# Patient Record
Sex: Male | Born: 1960
Health system: Southern US, Community
[De-identification: ages and names within clinical notes are randomized; demographics above are authoritative.]

## PROBLEM LIST (undated history)

## (undated) DIAGNOSIS — K649 Unspecified hemorrhoids: Secondary | ICD-10-CM

## (undated) DIAGNOSIS — E669 Obesity, unspecified: Secondary | ICD-10-CM

## (undated) DIAGNOSIS — M79676 Pain in unspecified toe(s): Secondary | ICD-10-CM

## (undated) DIAGNOSIS — G4733 Obstructive sleep apnea (adult) (pediatric): Secondary | ICD-10-CM

## (undated) DIAGNOSIS — K602 Anal fissure, unspecified: Secondary | ICD-10-CM

## (undated) DIAGNOSIS — G43109 Migraine with aura, not intractable, without status migrainosus: Secondary | ICD-10-CM

## (undated) DIAGNOSIS — M7700 Medial epicondylitis, unspecified elbow: Secondary | ICD-10-CM

## (undated) DIAGNOSIS — M7710 Lateral epicondylitis, unspecified elbow: Secondary | ICD-10-CM

## (undated) DIAGNOSIS — F419 Anxiety disorder, unspecified: Secondary | ICD-10-CM

## (undated) DIAGNOSIS — E785 Hyperlipidemia, unspecified: Secondary | ICD-10-CM

## (undated) DIAGNOSIS — D369 Benign neoplasm, unspecified site: Secondary | ICD-10-CM

## (undated) DIAGNOSIS — E039 Hypothyroidism, unspecified: Secondary | ICD-10-CM

## (undated) DIAGNOSIS — E119 Type 2 diabetes mellitus without complications: Secondary | ICD-10-CM

## (undated) DIAGNOSIS — E291 Testicular hypofunction: Secondary | ICD-10-CM

## (undated) DIAGNOSIS — E079 Disorder of thyroid, unspecified: Secondary | ICD-10-CM

## (undated) HISTORY — DX: Morbid (severe) obesity due to excess calories: E66.01

## (undated) HISTORY — DX: Migraine with aura, not intractable, without status migrainosus: G43.109

## (undated) HISTORY — DX: Benign neoplasm, unspecified site: D36.9

## (undated) HISTORY — PX: VASECTOMY: SHX75

## (undated) HISTORY — DX: Disorder of thyroid, unspecified: E07.9

## (undated) HISTORY — DX: Hyperlipidemia, unspecified: E78.5

## (undated) HISTORY — DX: Type 2 diabetes mellitus without complications: E11.9

## (undated) HISTORY — DX: Obesity, unspecified: E66.9

## (undated) HISTORY — PX: OTHER SURGICAL HISTORY: SHX169

## (undated) HISTORY — DX: Medial epicondylitis, unspecified elbow: M77.00

## (undated) HISTORY — PX: LAPAROSCOPIC GASTRIC SLEEVE RESECTION: SHX5895

## (undated) HISTORY — DX: Unspecified hemorrhoids: K64.9

## (undated) HISTORY — DX: Pain in unspecified toe(s): M79.676

## (undated) HISTORY — DX: Anxiety disorder, unspecified: F41.9

## (undated) HISTORY — DX: Obstructive sleep apnea (adult) (pediatric): G47.33

## (undated) HISTORY — DX: Testicular hypofunction: E29.1

## (undated) HISTORY — PX: ROTATOR CUFF REPAIR: SHX139

## (undated) HISTORY — DX: Hypothyroidism, unspecified: E03.9

## (undated) HISTORY — DX: Anal fissure, unspecified: K60.2

## (undated) HISTORY — DX: Lateral epicondylitis, unspecified elbow: M77.10

---
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
## 2013-11-22 DIAGNOSIS — E668 Other obesity: Secondary | ICD-10-CM | POA: Insufficient documentation

## 2013-11-22 DIAGNOSIS — E119 Type 2 diabetes mellitus without complications: Secondary | ICD-10-CM | POA: Insufficient documentation

## 2013-11-22 DIAGNOSIS — M8580 Other specified disorders of bone density and structure, unspecified site: Secondary | ICD-10-CM | POA: Insufficient documentation

## 2014-10-15 ENCOUNTER — Ambulatory Visit (INDEPENDENT_AMBULATORY_CARE_PROVIDER_SITE_OTHER): Payer: 59 | Admitting: Emergency Medicine

## 2014-10-15 VITALS — BP 122/80 | HR 60 | Temp 98.1°F | Resp 20 | Ht 75.0 in | Wt 298.2 lb

## 2014-10-15 DIAGNOSIS — J0140 Acute pansinusitis, unspecified: Secondary | ICD-10-CM

## 2014-10-15 DIAGNOSIS — J209 Acute bronchitis, unspecified: Secondary | ICD-10-CM

## 2014-10-15 DIAGNOSIS — E782 Mixed hyperlipidemia: Secondary | ICD-10-CM

## 2014-10-15 DIAGNOSIS — E039 Hypothyroidism, unspecified: Secondary | ICD-10-CM

## 2014-10-15 LAB — POCT CBC
Granulocyte percent: 80.3 %G — AB (ref 37–80)
HCT, POC: 47.8 % (ref 43.5–53.7)
Hemoglobin: 13 g/dL — AB (ref 14.1–18.1)
Lymph, poc: 1.2 (ref 0.6–3.4)
MCH, POC: 29.1 pg (ref 27–31.2)
MCHC: 33.4 g/dL (ref 31.8–35.4)
MCV: 87.1 fL (ref 80–97)
MID (cbc): 0.5 (ref 0–0.9)
MPV: 5.8 fL (ref 0–99.8)
POC Granulocyte: 7.1 — AB (ref 2–6.9)
POC LYMPH PERCENT: 13.5 %L (ref 10–50)
POC MID %: 6.5 %M (ref 0–12)
Platelet Count, POC: 261 10*3/uL (ref 142–424)
RBC: 5.48 M/uL (ref 4.69–6.13)
RDW, POC: 13.5 %
WBC: 8.8 10*3/uL (ref 4.6–10.2)

## 2014-10-15 LAB — TESTOSTERONE: Testosterone: 266 ng/dL — ABNORMAL LOW (ref 300–890)

## 2014-10-15 LAB — LIPID PANEL
Cholesterol: 261 mg/dL — ABNORMAL HIGH (ref 0–200)
HDL: 58 mg/dL (ref >39)
LDL Cholesterol: 179 mg/dL — ABNORMAL HIGH (ref 0–99)
Total CHOL/HDL Ratio: 4.5 Ratio
Triglycerides: 121 mg/dL (ref <150)
VLDL: 24 mg/dL (ref 0–40)

## 2014-10-15 LAB — COMPREHENSIVE METABOLIC PANEL
ALT: 19 U/L (ref 0–53)
AST: 19 U/L (ref 0–37)
Albumin: 4.2 g/dL (ref 3.5–5.2)
Alkaline Phosphatase: 68 U/L (ref 39–117)
BUN: 17 mg/dL (ref 6–23)
CO2: 30 mEq/L (ref 19–32)
Calcium: 9.3 mg/dL (ref 8.4–10.5)
Chloride: 100 mEq/L (ref 96–112)
Creat: 0.94 mg/dL (ref 0.50–1.35)
Glucose, Bld: 111 mg/dL — ABNORMAL HIGH (ref 70–99)
Potassium: 4.6 mEq/L (ref 3.5–5.3)
Sodium: 137 mEq/L (ref 135–145)
Total Bilirubin: 0.5 mg/dL (ref 0.2–1.2)
Total Protein: 7.1 g/dL (ref 6.0–8.3)

## 2014-10-15 LAB — TSH: TSH: 4.51 u[IU]/mL — ABNORMAL HIGH (ref 0.350–4.500)

## 2014-10-15 MED ORDER — HYDROCOD POLST-CHLORPHEN POLST 10-8 MG/5ML PO LQCR
5.0000 mL | Freq: Two times a day (BID) | ORAL | Status: DC | PRN
Start: 2014-10-15 — End: 2016-01-12

## 2014-10-15 MED ORDER — PSEUDOEPHEDRINE-GUAIFENESIN ER 60-600 MG PO TB12: 1.0000 | ORAL_TABLET | Freq: Two times a day (BID) | ORAL | Status: AC

## 2014-10-15 MED ORDER — AMOXICILLIN-POT CLAVULANATE 875-125 MG PO TABS: 1.0000 | ORAL_TABLET | Freq: Two times a day (BID) | ORAL | Status: DC

## 2014-10-15 NOTE — Addendum Note (Signed)
Addended by: Frutoso Chase A on: 10/15/2014 09:27 AM   Modules accepted: Orders

## 2014-10-15 NOTE — Patient Instructions (Signed)

## 2014-10-15 NOTE — Progress Notes (Addendum)
[facility] [address], [place] [address] [phone]  Date:  10/15/2014   Name:  Nathan Finley   DOB:  [birth year]   MRN:  [id number]  PCP:  No PCP Per Patient    Chief Complaint: Cough; Sinusitis; Lab Work; and Medication Refill   History of Present Illness:  Nathan Finley is a 54 y.o. very pleasant male patient who presents with the following:  Ill with nasal congestion and post nasal drainage and a purulent nasal drip.  Ill since [place] Year Has a cough productive of purulent sputum No wheezing or shortness of breath No fever or chills.  No nausea or vomiting No stool change or rash No improvement with over the counter medications or other home remedies.  Denies other complaint or health concern today.   There are no active problems to display for this patient.   Past Medical History  Diagnosis Date  . Anxiety   . Diabetes mellitus without complication   . Thyroid disease     hypothyroidism    Past Surgical History  Procedure Laterality Date  . Vasectomy    . Laparoscopic gastric sleeve resection      History  Substance Use Topics  . Smoking status: Never Smoker   . Smokeless tobacco: Not on file  . Alcohol Use: No    Family History  Problem Relation Age of Onset  . Heart disease Father     Allergies  Allergen Reactions  . Iodine Hives    Medication list has been reviewed and updated.  No current outpatient prescriptions on file prior to visit.   No current facility-administered medications on file prior to visit.    Review of Systems:  As per HPI, otherwise negative.    Physical Examination: Filed Vitals:   10/15/14 0839  BP: 122/80  Pulse: 60  Temp: 98.1 F (36.7 C)  Resp: 20   Filed Vitals:   10/15/14 0839  Height: 6\' 3"  (1.905 m)  Weight: 298 lb 3.2 oz (135.263 kg)   Body mass index is 37.27 kg/(m^2). Ideal Body Weight: Weight in (lb) to have BMI = 25: 199.6  GEN: WDWN, NAD, Non-toxic, A & O x  3 HEENT: Atraumatic, Normocephalic. Neck supple. No masses, No LAD. Ears and Nose: No external deformity. CV: RRR, No M/G/R. No JVD. No thrill. No extra heart sounds. PULM: scattered wheezes, no crackles, rhonchi. No retractions. No resp. distress. No accessory muscle use. ABD: S, NT, ND, +BS. No rebound. No HSM. EXTR: No c/c/e NEURO Normal gait.  PSYCH: Normally interactive. Conversant. Not depressed or anxious appearing.  Calm demeanor.    Assessment and Plan: Augment mucinex tussionex  Signed,  Ellison Carwin, MD   Results for orders placed or performed in visit on 10/15/14  Comprehensive metabolic panel  Result Value Ref Range   Sodium 137 135 - 145 mEq/L   Potassium 4.6 3.5 - 5.3 mEq/L   Chloride 100 96 - 112 mEq/L   CO2 30 19 - 32 mEq/L   Glucose, Bld 111 (H) 70 - 99 mg/dL   BUN 17 6 - 23 mg/dL   Creat 0.94 0.50 - 1.35 mg/dL   Total Bilirubin 0.5 0.2 - 1.2 mg/dL   Alkaline Phosphatase 68 39 - 117 U/L   AST 19 0 - 37 U/L   ALT 19 0 - 53 U/L   Total Protein 7.1 6.0 - 8.3 g/dL   Albumin 4.2 3.5 - 5.2 g/dL   Calcium 9.3 8.4 -  10.5 mg/dL  Lipid panel  Result Value Ref Range   Cholesterol 261 (H) 0 - 200 mg/dL   Triglycerides 121 <150 mg/dL   HDL 58 >39 mg/dL   Total CHOL/HDL Ratio 4.5 Ratio   VLDL 24 0 - 40 mg/dL   LDL Cholesterol 179 (H) 0 - 99 mg/dL  Testosterone  Result Value Ref Range   Testosterone 266 (L) 300 - 890 ng/dL  TSH  Result Value Ref Range   TSH 4.510 (H) 0.350 - 4.500 uIU/mL  POCT CBC  Result Value Ref Range   WBC 8.8 4.6 - 10.2 K/uL   Lymph, poc 1.2 0.6 - 3.4   POC LYMPH PERCENT 13.5 10 - 50 %L   MID (cbc) 0.5 0 - 0.9   POC MID % 6.5 0 - 12 %M   POC Granulocyte 7.1 (A) 2 - 6.9   Granulocyte percent 80.3 (A) 37 - 80 %G   RBC 5.48 4.69 - 6.13 M/uL   Hemoglobin 13.0 (A) 14.1 - 18.1 g/dL   HCT, POC 47.8 43.5 - 53.7 %   MCV 87.1 80 - 97 fL   MCH, POC 29.1 27 - 31.2 pg   MCHC 33.4 31.8 - 35.4 g/dL   RDW, POC 13.5 %   Platelet Count,  POC 261 142 - 424 K/uL   MPV 5.8 0 - 99.8 fL   Results for orders placed or performed in visit on 10/15/14  Comprehensive metabolic panel  Result Value Ref Range   Sodium 137 135 - 145 mEq/L   Potassium 4.6 3.5 - 5.3 mEq/L   Chloride 100 96 - 112 mEq/L   CO2 30 19 - 32 mEq/L   Glucose, Bld 111 (H) 70 - 99 mg/dL   BUN 17 6 - 23 mg/dL   Creat 0.94 0.50 - 1.35 mg/dL   Total Bilirubin 0.5 0.2 - 1.2 mg/dL   Alkaline Phosphatase 68 39 - 117 U/L   AST 19 0 - 37 U/L   ALT 19 0 - 53 U/L   Total Protein 7.1 6.0 - 8.3 g/dL   Albumin 4.2 3.5 - 5.2 g/dL   Calcium 9.3 8.4 - 10.5 mg/dL  Lipid panel  Result Value Ref Range   Cholesterol 261 (H) 0 - 200 mg/dL   Triglycerides 121 <150 mg/dL   HDL 58 >39 mg/dL   Total CHOL/HDL Ratio 4.5 Ratio   VLDL 24 0 - 40 mg/dL   LDL Cholesterol 179 (H) 0 - 99 mg/dL  Testosterone  Result Value Ref Range   Testosterone 266 (L) 300 - 890 ng/dL  TSH  Result Value Ref Range   TSH 4.510 (H) 0.350 - 4.500 uIU/mL  POCT CBC  Result Value Ref Range   WBC 8.8 4.6 - 10.2 K/uL   Lymph, poc 1.2 0.6 - 3.4   POC LYMPH PERCENT 13.5 10 - 50 %L   MID (cbc) 0.5 0 - 0.9   POC MID % 6.5 0 - 12 %M   POC Granulocyte 7.1 (A) 2 - 6.9   Granulocyte percent 80.3 (A) 37 - 80 %G   RBC 5.48 4.69 - 6.13 M/uL   Hemoglobin 13.0 (A) 14.1 - 18.1 g/dL   HCT, POC 47.8 43.5 - 53.7 %   MCV 87.1 80 - 97 fL   MCH, POC 29.1 27 - 31.2 pg   MCHC 33.4 31.8 - 35.4 g/dL   RDW, POC 13.5 %   Platelet Count, POC 261 142 - 424 K/uL   MPV  5.8 0 - 99.8 fL   Patient needs to follow up with endocrinology for BS 111, cholesterol 261, LDL 179, TSH 4.51.  His testosterone is still low at 266.

## 2014-10-16 ENCOUNTER — Other Ambulatory Visit: Payer: Self-pay | Admitting: Emergency Medicine

## 2014-10-16 MED ORDER — LEVOTHYROXINE SODIUM 112 MCG PO TABS: 112.0000 ug | ORAL_TABLET | Freq: Every day | ORAL | Status: DC

## 2014-10-16 MED ORDER — ATORVASTATIN CALCIUM 40 MG PO TABS: 40.0000 mg | ORAL_TABLET | Freq: Every day | ORAL | Status: DC

## 2014-10-18 ENCOUNTER — Telehealth: Payer: Self-pay

## 2014-10-18 NOTE — Telephone Encounter (Signed)
Patient needs to follow up with endocrinology for BS 111, cholesterol 261, LDL 179, TSH 4.51. His testosterone is still low at 266.-Dr. Ouida Sills  Pt notified and copy sent to take with him to endo

## 2015-04-26 ENCOUNTER — Other Ambulatory Visit: Payer: Self-pay | Admitting: Emergency Medicine

## 2015-11-24 ENCOUNTER — Ambulatory Visit (INDEPENDENT_AMBULATORY_CARE_PROVIDER_SITE_OTHER): Payer: 59 | Admitting: Family Medicine

## 2015-11-24 VITALS — BP 114/72 | HR 79 | Temp 98.1°F | Resp 16 | Ht 74.75 in | Wt 323.8 lb

## 2015-11-24 DIAGNOSIS — J111 Influenza due to unidentified influenza virus with other respiratory manifestations: Secondary | ICD-10-CM

## 2015-11-24 MED ORDER — OSELTAMIVIR PHOSPHATE 75 MG PO CAPS: 75.0000 mg | ORAL_CAPSULE | Freq: Two times a day (BID) | ORAL | Status: DC

## 2015-11-24 NOTE — Progress Notes (Signed)
Nathan Finley MRN: [id number], DOB: 08/17/1961, 55 y.o. Date of Encounter: 11/24/2015, 4:22 PM  Primary Physician: No PCP Per Patient  Chief Complaint:  Chief Complaint  Patient presents with  . Sore Throat  . Cough    Non productive  . Headache  . Nasal Congestion  . Fatigue  . Generalized Body Aches    HPI: 55 y.o. year old male presents with a 4 day history of nasal congestion, post nasal drip, sore throat, and cough. Mild sinus pressure. Afebrile. No chills. Nasal congestion thick and green/yellow. Cough is productive of green/yellow sputum and not associated with time of day. Ears feel full, leading to sensation of muffled hearing. Has tried OTC cold preps without success. No GI complaints.   No sick contacts, recent antibiotics, or recent travels.   Patient is a Fish farm manager disability judge  No leg trauma, sedentary periods, h/o cancer, or tobacco use.  Past Medical History  Diagnosis Date  . Anxiety   . Diabetes mellitus without complication ([place])   . Thyroid disease     hypothyroidism     Home Meds: Prior to Admission medications   Medication Sig Start Date End Date Taking? Authorizing Provider  diazepam (VALIUM) 10 MG tablet Take 10 mg by mouth every 12 (twelve) hours as needed for anxiety.   Yes Historical Provider, MD  FLUoxetine (PROZAC) 20 MG capsule Take 20 mg by mouth daily.   Yes Historical Provider, MD  Omega-3 Fatty Acids (FISH OIL PO) Take by mouth.   Yes Historical Provider, MD  Thyroid (NATURE-THROID PO) Take by mouth.   Yes Historical Provider, MD  amoxicillin-clavulanate (AUGMENTIN) 875-125 MG per tablet Take 1 tablet by mouth 2 (two) times daily. Patient not taking: Reported on 11/24/2015 10/15/14   Roselee Culver, MD  atorvastatin (LIPITOR) 40 MG tablet Take 1 tablet (40 mg total) by mouth daily. PATIENT NEEDS OFFICE VISIT FOR ADDITIONAL REFILLS Patient not taking: Reported on 11/24/2015 04/28/15   Mancel Bale, PA-C   chlorpheniramine-HYDROcodone (TUSSIONEX PENNKINETIC ER) 10-8 MG/5ML LQCR Take 5 mLs by mouth every 12 (twelve) hours as needed. Patient not taking: Reported on 11/24/2015 10/15/14   Roselee Culver, MD  levothyroxine (SYNTHROID, LEVOTHROID) 112 MCG tablet Take 1 tablet (112 mcg total) by mouth daily before breakfast. Patient not taking: Reported on 11/24/2015 10/16/14   Roselee Culver, MD  oseltamivir (TAMIFLU) 75 MG capsule Take 1 capsule (75 mg total) by mouth 2 (two) times daily. 11/24/15   Robyn Haber, MD  testosterone Renae Gloss) 5 MG/24HR Place 1 patch onto the skin daily. Reported on 11/24/2015    Historical Provider, MD    Allergies:  Allergies  Allergen Reactions  . Iodine Hives    Social History   Social History  . Marital Status: Married    Spouse Name: N/A  . Number of Children: N/A  . Years of Education: N/A   Occupational History  . Not on file.   Social History Main Topics  . Smoking status: Never Smoker   . Smokeless tobacco: Not on file  . Alcohol Use: No  . Drug Use: No  . Sexual Activity: Not on file   Other Topics Concern  . Not on file   Social History Narrative     Review of Systems: Constitutional: negative for chills, fever, night sweats or weight changes Cardiovascular: negative for chest pain or palpitations Respiratory: negative for hemoptysis, wheezing, or shortness of breath Abdominal: negative for abdominal pain, nausea, vomiting or diarrhea Dermatological: negative  for rash Neurologic: negative for headache   Physical Exam: Blood pressure 114/72, pulse 79, temperature 98.1 F (36.7 C), temperature source Oral, resp. rate 16, height 6' 2.75" (1.899 m), weight 323 lb 12.8 oz (146.875 kg), SpO2 98 %., Body mass index is 40.73 kg/(m^2). General: Well developed, well nourished, in no acute distress. Head: Normocephalic, atraumatic, eyes without discharge, sclera non-icteric, nares are congested. Bilateral auditory canals clear, TM's  are without perforation, pearly grey with reflective cone of light bilaterally. No sinus TTP. Oral cavity moist, dentition normal. Posterior pharynx with post nasal drip and mild erythema. No peritonsillar abscess or tonsillar exudate. Neck: Supple. No thyromegaly. Full ROM. No lymphadenopathy. Lungs: Coarse breath sounds bilaterally without wheezes, rales, or rhonchi. Breathing is unlabored.  Heart: RRR with S1 S2. No murmurs, rubs, or gallops appreciated. Msk:  Strength and tone normal for age. Extremities: No clubbing or cyanosis. No edema. Neuro: Alert and oriented X 3. Moves all extremities spontaneously. CNII-XII grossly in tact. Psych:  Responds to questions appropriately with a normal affect.      ASSESSMENT AND PLAN:  55 y.o. year old male with bronchitis. -   ICD-9-CM ICD-10-CM   1. Influenza with respiratory manifestation 487.1 J11.1 oseltamivir (TAMIFLU) 75 MG capsule   -Tylenol/Motrin prn -Rest/fluids -RTC precautions -RTC 3-5 days if no improvement  Signed, Robyn Haber, MD 11/24/2015 4:22 PM

## 2015-11-24 NOTE — Patient Instructions (Signed)

## 2016-01-12 ENCOUNTER — Ambulatory Visit (INDEPENDENT_AMBULATORY_CARE_PROVIDER_SITE_OTHER): Payer: 59 | Admitting: Urgent Care

## 2016-01-12 VITALS — BP 124/80 | HR 74 | Temp 98.2°F | Resp 18 | Ht 74.75 in | Wt 320.0 lb

## 2016-01-12 DIAGNOSIS — M25521 Pain in right elbow: Secondary | ICD-10-CM | POA: Diagnosis not present

## 2016-01-12 MED ORDER — DICLOFENAC SODIUM 1 % TD GEL: TRANSDERMAL | Status: DC

## 2016-01-12 NOTE — Patient Instructions (Addendum)
Tennis Elbow Tennis elbow (lateral epicondylitis) is inflammation of the outer tendons of your forearm close to your elbow. Your tendons attach your muscles to your bones. The outer tendons of your forearm are used to extend your wrist, and they attach on the outside part of your elbow. Tennis elbow is often found in people who play tennis, but anyone may get the condition from repeatedly extending the wrist or turning the forearm. CAUSES This condition is caused by repeatedly extending your wrist and using your hands. It can result from sports or work that requires repetitive forearm movements. Tennis elbow may also be caused by an injury. RISK FACTORS You have a higher risk of developing tennis elbow if you play tennis or another racquet sport. You also have a higher risk if you frequently use your hands for work. This condition is also more likely to develop in:  Musicians.  Carpenters, painters, and plumbers.  Cooks.  Cashiers.  People who work in factories.  Construction workers.  Butchers.  People who use computers. SYMPTOMS Symptoms of this condition include:  Pain and tenderness in your forearm and the outer part of your elbow. You may only feel the pain when you use your arm, or you may feel it even when you are not using your arm.  A burning feeling that runs from your elbow through your arm.  Weak grip in your hands. DIAGNOSIS  This condition may be diagnosed by medical history and physical exam. You may also have other tests, including:  X-rays.  MRI. TREATMENT Your health care provider will recommend lifestyle adjustments, such as resting and icing your arm. Treatment may also include:  Medicines for inflammation. This may include shots of cortisone if your pain continues.  Physical therapy. This may include massage or exercises.  An elbow brace. Surgery may eventually be recommended if your pain does not go away with treatment. HOME CARE  INSTRUCTIONS Activity  Rest your elbow and wrist as directed by your health care provider. Try to avoid any activities that caused the problem until your health care provider says that you can do them again.  If a physical therapist teaches you exercises, do all of them as directed.  If you lift an object, lift it with your palm facing upward. This lowers the stress on your elbow. Lifestyle  If your tennis elbow is caused by sports, check your equipment and make sure that:  You are using it correctly.  It is the best fit for you.  If your tennis elbow is caused by work, take breaks frequently, if you are able. Talk with your manager about how to best perform tasks in a way that is safe.  If your tennis elbow is caused by computer use, talk with your manager about any changes that can be made to your work environment. General Instructions  If directed, apply ice to the painful area:  Put ice in a plastic bag.  Place a towel between your skin and the bag.  Leave the ice on for 20 minutes, 2-3 times per day.  Take medicines only as directed by your health care provider.  If you were given a brace, wear it as directed by your health care provider.  Keep all follow-up visits as directed by your health care provider. This is important. SEEK MEDICAL CARE IF:  Your pain does not get better with treatment.  Your pain gets worse.  You have numbness or weakness in your forearm, hand, or fingers.     This information is not intended to replace advice given to you by your health care provider. Make sure you discuss any questions you have with your health care provider.   Document Released: 09/25/2005 Document Revised: 02/09/2015 Document Reviewed: 09/21/2014 Elsevier Interactive Patient Education 2016 [employer].     IF you received an x-ray today, you will receive an invoice from [facility]. Please contact [facility] at [phone] with questions or  concerns regarding your invoice.   IF you received labwork today, you will receive an invoice from [employer]. Please contact [facility] at [phone] with questions or concerns regarding your invoice.   Our billing staff will not be able to assist you with questions regarding bills from these companies.  You will be contacted with the lab results as soon as they are available. The fastest way to get your results is to activate your My Chart account. Instructions are located on the last page of this paperwork. If you have not heard from [place] regarding the results in 2 weeks, please contact this office.

## 2016-01-12 NOTE — Progress Notes (Signed)
    MRN: [id number] DOB: 1961/01/19  Subjective:   Nathan Finley is a 55 y.o. male presenting for chief complaint of Elbow Pain  Reports 4 week history of right elbow pain. Pain started after mixing a lot of ground meat for food. Patient decided to rest for 1 week, went back and decided to rest more. He still has pain, now having numbness and tingling in his 4-5th fingers. Reports that he hit his elbow 2 days ago and has felt significant pain. Currently, pain is rated 3/10, throbbing in nature with intermittent sharp pains. Patient has gastric sleeve and cannot take NSAIDs. Denies history of elbow surgeries.  Tierney has a current medication list which includes the following prescription(s): diazepam, fluoxetine, levothyroxine, omega-3 fatty acids, and testosterone cypionate. Also is allergic to iodine.  Kenniel  has a past medical history of Anxiety; Diabetes mellitus without complication ([place]); and Thyroid disease. Also  has past surgical history that includes Vasectomy and Laparoscopic gastric sleeve resection.  Objective:   Vitals: BP 124/80 mmHg  Pulse 74  Temp(Src) 98.2 F (36.8 C) (Oral)  Resp 18  Ht 6' 2.75" (1.899 m)  Wt 320 lb (145.151 kg)  BMI 40.25 kg/m2  SpO2 97%  Physical Exam  Constitutional: He is oriented to person, place, and time. He appears well-developed and well-nourished.  Cardiovascular: Normal rate.   Pulmonary/Chest: Effort normal.  Musculoskeletal:       Right elbow: He exhibits normal range of motion, no swelling, no effusion, no deformity and no laceration. Tenderness found. Medial epicondyle, lateral epicondyle (worse than medial) and olecranon process (mild) tenderness noted. No radial head tenderness noted.  Neurological: He is alert and oriented to person, place, and time. He has normal reflexes.  Sensation intact.   Assessment and Plan :   1. Elbow pain, right - Patient opted for conservative management. If no improvement in 2 weeks, patient opted  for no physical therapy and prefers to go to ortho.  Jaynee Eagles, PA-C [facility] [phone] 01/12/2016 6:46 PM

## 2016-02-09 DIAGNOSIS — K635 Polyp of colon: Secondary | ICD-10-CM | POA: Insufficient documentation

## 2016-02-09 DIAGNOSIS — E039 Hypothyroidism, unspecified: Secondary | ICD-10-CM | POA: Insufficient documentation

## 2016-03-13 DIAGNOSIS — E1169 Type 2 diabetes mellitus with other specified complication: Secondary | ICD-10-CM | POA: Insufficient documentation

## 2016-05-09 DIAGNOSIS — Z903 Acquired absence of stomach [part of]: Secondary | ICD-10-CM | POA: Insufficient documentation

## 2016-08-30 ENCOUNTER — Ambulatory Visit (INDEPENDENT_AMBULATORY_CARE_PROVIDER_SITE_OTHER): Payer: 59

## 2016-08-30 ENCOUNTER — Ambulatory Visit (INDEPENDENT_AMBULATORY_CARE_PROVIDER_SITE_OTHER): Payer: 59 | Admitting: Physician Assistant

## 2016-08-30 VITALS — BP 110/80 | HR 82 | Temp 98.2°F | Resp 17 | Ht 74.75 in | Wt 311.0 lb

## 2016-08-30 DIAGNOSIS — R0602 Shortness of breath: Secondary | ICD-10-CM

## 2016-08-30 DIAGNOSIS — R05 Cough: Secondary | ICD-10-CM

## 2016-08-30 DIAGNOSIS — J189 Pneumonia, unspecified organism: Secondary | ICD-10-CM

## 2016-08-30 DIAGNOSIS — R059 Cough, unspecified: Secondary | ICD-10-CM

## 2016-08-30 LAB — POCT CBC
Granulocyte percent: 74 %G (ref 37–80)
HCT, POC: 45.1 % (ref 43.5–53.7)
Hemoglobin: 16.2 g/dL (ref 14.1–18.1)
Lymph, poc: 1.9 (ref 0.6–3.4)
MCH, POC: 29.8 pg (ref 27–31.2)
MCHC: 36 g/dL — AB (ref 31.8–35.4)
MCV: 82.7 fL (ref 80–97)
MID (cbc): 0.8 (ref 0–0.9)
MPV: 6.1 fL (ref 0–99.8)
POC Granulocyte: 7.7 — AB (ref 2–6.9)
POC LYMPH PERCENT: 18.4 %L (ref 10–50)
POC MID %: 7.6 %M (ref 0–12)
Platelet Count, POC: 297 10*3/uL (ref 142–424)
RBC: 5.45 M/uL (ref 4.69–6.13)
RDW, POC: 13.3 %
WBC: 10.4 10*3/uL — AB (ref 4.6–10.2)

## 2016-08-30 MED ORDER — AZITHROMYCIN 250 MG PO TABS: ORAL_TABLET | ORAL | 0 refills | Status: DC

## 2016-08-30 MED ORDER — ALBUTEROL SULFATE HFA 108 (90 BASE) MCG/ACT IN AERS: 2.0000 | INHALATION_SPRAY | RESPIRATORY_TRACT | 1 refills | Status: DC | PRN

## 2016-08-30 NOTE — Progress Notes (Signed)
Nathan Finley  MRN: [id number] DOB: [birth year]  PCP: No PCP Per Patient  Subjective:  Pt Nathan Finley a 55 year old male, history of HLD, diabetes and hypothyroidism, who presents to clinic for SOB and cough.  He reports cough x eight weeks. His children were sick two months ago and he also developed a cough but thought it would resolve. It has been intermittent over the last several weeks, improving then worsening. Describes his cough as mostly non-productive, however occasionally coughs up white phlegm. He sometimes developes SOB like a feeling of "not being able to take ea full deep breath" the past few days. He occasionally loses his breath while talking then has coughing fit. Notes a few times of light-headedness when he stands too quickly from seated position.  He has not taken anything to feel better. Denies fever, chills, chest pain, palpitations.   Review of Systems  Constitutional: Negative for chills, diaphoresis, fatigue and fever.  Respiratory: Positive for cough and shortness of breath. Negative for chest tightness and wheezing.   Cardiovascular: Negative for chest pain, palpitations and leg swelling.  Gastrointestinal: Negative for abdominal pain, diarrhea, nausea and vomiting.  Neurological: Negative for dizziness, syncope, light-headedness and headaches.  Psychiatric/Behavioral: Negative for sleep disturbance.    There are no active problems to display for this patient.   Current Outpatient Prescriptions on File Prior to Visit  Medication Sig Dispense Refill  . levothyroxine (SYNTHROID, LEVOTHROID) 112 MCG tablet Take 1 tablet (112 mcg total) by mouth daily before breakfast. 30 tablet 2  . Omega-3 Fatty Acids (FISH OIL PO) Take by mouth.     No current facility-administered medications on file prior to visit.     Allergies  Allergen Reactions  . Iodine Hives     Objective:  BP 110/80 (BP Location: Right Arm, Patient Position: Sitting, Cuff Size: Large)   Pulse 82    Temp 98.2 F (36.8 C) (Oral)   Resp 17   Ht 6' 2.75" (1.899 m)   Wt (!) 311 lb (141.1 kg)   SpO2 97%   BMI 39.13 kg/m   Physical Exam  Constitutional: He is oriented to person, place, and time and well-developed, well-nourished, and in no distress. No distress.  obese  Cardiovascular: Normal rate, regular rhythm and normal heart sounds.   Pulmonary/Chest: Effort normal. No respiratory distress. He has no decreased breath sounds. He has no wheezes. He has rales in the left lower field.  Lymphadenopathy:    He has no cervical adenopathy.  Neurological: He is alert and oriented to person, place, and time. GCS score is 15.  Skin: Skin is warm and dry.  Psychiatric: Mood, memory, affect and judgment normal.  Vitals reviewed.  Dg Chest 2 View  Result Date: 08/30/2016 CLINICAL DATA:  Shortness of breath, cough for 8 weeks EXAM: CHEST  2 VIEW COMPARISON:  None. FINDINGS: The heart size and mediastinal contours are within normal limits. Both lungs are clear. The visualized skeletal structures are unremarkable. IMPRESSION: No active cardiopulmonary disease. Electronically Signed   By: Kathreen Devoid   On: 08/30/2016 14:24   Results for orders placed or performed in visit on 08/30/16  POCT CBC  Result Value Ref Range   WBC 10.4 (A) 4.6 - 10.2 K/uL   Lymph, poc 1.9 0.6 - 3.4   POC LYMPH PERCENT 18.4 10 - 50 %L   MID (cbc) 0.8 0 - 0.9   POC MID % 7.6 0 - 12 %M   POC Granulocyte 7.7 (A)  2 - 6.9   Granulocyte percent 74.0 37 - 80 %G   RBC 5.45 4.69 - 6.13 M/uL   Hemoglobin 16.2 14.1 - 18.1 g/dL   HCT, POC 45.1 43.5 - 53.7 %   MCV 82.7 80 - 97 fL   MCH, POC 29.8 27 - 31.2 pg   MCHC 36.0 (A) 31.8 - 35.4 g/dL   RDW, POC 13.3 %   Platelet Count, POC 297 142 - 424 K/uL   MPV 6.1 0 - 99.8 fL    Assessment and Plan :  1. Cough - POCT CBC - DG Chest 2 View; Future - albuterol (PROVENTIL HFA;VENTOLIN HFA) 108 (90 Base) MCG/ACT inhaler; Inhale 2 puffs into the lungs every 4 (four) hours as  needed for wheezing or shortness of breath (cough, shortness of breath or wheezing.).  Dispense: 1 Inhaler; Refill: 1  2. Community acquired pneumonia, unspecified laterality - azithromycin (ZITHROMAX) 250 MG tablet; Take 2 tabs PO x 1 dose, then 1 tab PO QD x 4 days  Dispense: 6 tablet; Refill: 0 - Negative chest x-ray, elevated WBC. Will treat with antibiotics as per his physical exam and elevated WBC  - Supportive care: Drink plenty of fluids and rest. RTC in 5-7 days if no improvement.   Mercer Pod, PA-C  [facility] 08/30/2016 2:05 PM

## 2016-08-30 NOTE — Patient Instructions (Addendum)
Please stay well hydrated while you are clearing this infection. Take the ENTIRE course of medication, even if you start to feel better.  Use the inhaler 2 puffs every 4-6 hours for the first 1-2 days, then use it as needed for shortness of breath.   Don't forget to check out [employer] reviews in [place]  Thank you for coming in today. I hope you feel we met your needs.  Feel free to call [facility] if you have any questions or further requests.  Please consider signing up for MyChart if you do not already have it, as this is a great way to communicate with me.  Best,  Whitney McVey, PA-C   IF you received an x-ray today, you will receive an invoice from [facility]. Please contact [facility] at [phone] with questions or concerns regarding your invoice.   IF you received labwork today, you will receive an invoice from [employer]. Please contact [facility] at [phone] with questions or concerns regarding your invoice.   Our billing staff will not be able to assist you with questions regarding bills from these companies.  You will be contacted with the lab results as soon as they are available. The fastest way to get your results is to activate your My Chart account. Instructions are located on the last page of this paperwork. If you have not heard from [place] regarding the results in 2 weeks, please contact this office.

## 2016-12-12 DIAGNOSIS — M5126 Other intervertebral disc displacement, lumbar region: Secondary | ICD-10-CM | POA: Insufficient documentation

## 2016-12-12 DIAGNOSIS — M5136 Other intervertebral disc degeneration, lumbar region: Secondary | ICD-10-CM | POA: Insufficient documentation

## 2017-07-27 DIAGNOSIS — E669 Obesity, unspecified: Secondary | ICD-10-CM | POA: Insufficient documentation

## 2019-12-24 DIAGNOSIS — F3342 Major depressive disorder, recurrent, in full remission: Secondary | ICD-10-CM | POA: Diagnosis not present

## 2020-01-15 DIAGNOSIS — G4733 Obstructive sleep apnea (adult) (pediatric): Secondary | ICD-10-CM | POA: Diagnosis not present

## 2020-01-15 DIAGNOSIS — E291 Testicular hypofunction: Secondary | ICD-10-CM | POA: Diagnosis not present

## 2020-01-15 DIAGNOSIS — E785 Hyperlipidemia, unspecified: Secondary | ICD-10-CM | POA: Diagnosis not present

## 2020-01-15 DIAGNOSIS — E1169 Type 2 diabetes mellitus with other specified complication: Secondary | ICD-10-CM | POA: Diagnosis not present

## 2020-01-15 DIAGNOSIS — R030 Elevated blood-pressure reading, without diagnosis of hypertension: Secondary | ICD-10-CM | POA: Diagnosis not present

## 2020-03-03 DIAGNOSIS — R1084 Generalized abdominal pain: Secondary | ICD-10-CM | POA: Diagnosis not present

## 2020-03-03 DIAGNOSIS — R509 Fever, unspecified: Secondary | ICD-10-CM | POA: Diagnosis not present

## 2020-03-03 DIAGNOSIS — R197 Diarrhea, unspecified: Secondary | ICD-10-CM | POA: Diagnosis not present

## 2020-06-10 DIAGNOSIS — E039 Hypothyroidism, unspecified: Secondary | ICD-10-CM | POA: Diagnosis not present

## 2020-06-10 DIAGNOSIS — Z125 Encounter for screening for malignant neoplasm of prostate: Secondary | ICD-10-CM | POA: Diagnosis not present

## 2020-06-10 DIAGNOSIS — E1169 Type 2 diabetes mellitus with other specified complication: Secondary | ICD-10-CM | POA: Diagnosis not present

## 2020-06-10 DIAGNOSIS — E785 Hyperlipidemia, unspecified: Secondary | ICD-10-CM | POA: Diagnosis not present

## 2020-06-10 DIAGNOSIS — E291 Testicular hypofunction: Secondary | ICD-10-CM | POA: Diagnosis not present

## 2020-06-18 ENCOUNTER — Other Ambulatory Visit: Payer: Self-pay | Admitting: Internal Medicine

## 2020-06-18 DIAGNOSIS — E1169 Type 2 diabetes mellitus with other specified complication: Secondary | ICD-10-CM | POA: Diagnosis not present

## 2020-06-18 DIAGNOSIS — Z1212 Encounter for screening for malignant neoplasm of rectum: Secondary | ICD-10-CM | POA: Diagnosis not present

## 2020-06-18 DIAGNOSIS — E785 Hyperlipidemia, unspecified: Secondary | ICD-10-CM

## 2020-06-18 DIAGNOSIS — Z23 Encounter for immunization: Secondary | ICD-10-CM | POA: Diagnosis not present

## 2020-06-18 DIAGNOSIS — Z0000 Encounter for general adult medical examination without abnormal findings: Secondary | ICD-10-CM | POA: Diagnosis not present

## 2020-06-18 DIAGNOSIS — R82998 Other abnormal findings in urine: Secondary | ICD-10-CM | POA: Diagnosis not present

## 2020-07-02 ENCOUNTER — Ambulatory Visit
Admission: RE | Admit: 2020-07-02 | Discharge: 2020-07-02 | Disposition: A | Discharge status: Home / Self Care | Payer: Self-pay | Source: Ambulatory Visit | Attending: Internal Medicine | Admitting: Internal Medicine

## 2020-07-02 DIAGNOSIS — E785 Hyperlipidemia, unspecified: Secondary | ICD-10-CM | POA: Diagnosis not present

## 2020-07-07 ENCOUNTER — Ambulatory Visit (INDEPENDENT_AMBULATORY_CARE_PROVIDER_SITE_OTHER): Payer: Federal, State, Local not specified - PPO | Admitting: Podiatry

## 2020-07-07 ENCOUNTER — Other Ambulatory Visit: Payer: Self-pay

## 2020-07-07 DIAGNOSIS — E119 Type 2 diabetes mellitus without complications: Secondary | ICD-10-CM | POA: Diagnosis not present

## 2020-07-07 DIAGNOSIS — M2041 Other hammer toe(s) (acquired), right foot: Secondary | ICD-10-CM

## 2020-07-07 MED ORDER — CYCLOBENZAPRINE HCL 10 MG PO TABS: 10.0000 mg | ORAL_TABLET | Freq: Every day | ORAL | 1 refills | Status: DC

## 2020-07-07 NOTE — Progress Notes (Signed)
   HPI: 59 y.o. male presenting today for evaluation of pain and tenderness to the right forefoot.  He states that intermittently he will experience some cramping of the toes.  Patient does have history of diabetes that is well controlled.  He denies trauma or injury to the area.  He presents for further treatment and evaluation  Past Medical History:  Diagnosis Date  . Anxiety   . Diabetes mellitus without complication ([place])   . Thyroid disease    hypothyroidism      Objective: Physical Exam General: The patient is alert and oriented x3 in no acute distress.  Dermatology: Skin is cool, dry and supple bilateral lower extremities. Negative for open lesions or macerations.  Vascular: Palpable pedal pulses bilaterally. No edema or erythema noted. Capillary refill within normal limits.  Neurological: Epicritic and protective threshold grossly intact bilaterally.   Musculoskeletal Exam: All pedal and ankle joints range of motion within normal limits bilateral. Muscle strength 5/5 in all groups bilateral. Hammertoe contracture deformity noted to digits 2-5 right foot.  Radiographic Exam: Hammertoe contracture deformity noted to the interphalangeal joints and MPJ of the respective hammertoe digits mentioned on clinical musculoskeletal exam.     Assessment: 1.  Hammertoes digits 2-5 right foot 2.  Diabetes mellitus-well controlled   Plan of Care:  1. Patient evaluated. X-Rays reviewed.  2.  Patient does experience intermittent cramping with the hammertoes.  Prescription for Flexeril 10 mg nightly as needed cramping 3.  Recommend good supportive shoes 4.  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 Edrick Kins, DPM    [address], [place] [address]                Office [phone]  Fax [phone]

## 2020-07-08 ENCOUNTER — Other Ambulatory Visit: Payer: Self-pay | Admitting: Internal Medicine

## 2020-07-08 DIAGNOSIS — R911 Solitary pulmonary nodule: Secondary | ICD-10-CM

## 2020-08-16 NOTE — Progress Notes (Signed)
CARDIOLOGY CONSULT NOTE       Patient ID: Domenik Trice MRN: [id number] DOB/AGE: April 13, 1961 59 y.o.  Admit date: (Not on file) Referring Physician: Perini Primary Physician: Crist Infante, MD Primary Cardiologist: New Reason for Consultation: CAD  Active Problems:   * No active hospital problems. *   HPI:  59 y.o. with history of HLD, DM, hypothyroidism seen for Dr Joylene Draft for CAD Reviewed his coronary calcium score done 07/02/20 and read by Radiology. 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 He is trying to exercise some at [employer] No chest pains    ROS All other systems reviewed and negative except as noted above  Past Medical History:  Diagnosis Date  . Anal fissure   . Anxiety   . Benign neoplasm   . Diabetes mellitus without complication ([place])   . Golfer's elbow   . Hemorrhoids   . Hyperlipidemia   . Hypothyroidism   . Migraine with aura   . Morbid obesity ([place])   . Obesity   . OSA (obstructive sleep apnea)   . Tennis elbow   . Testicular hypofunction   . Thyroid disease    hypothyroidism  . Toe pain     Family History  Problem Relation Age of Onset  . High Cholesterol Mother   . Heart disease Father   . Cancer Maternal Grandmother     Social History   Socioeconomic History  . Marital status: Married    Spouse name: Not on file  . Number of children: Not on file  . Years of education: Not on file  . Highest education level: Not on file  Occupational History  . Not on file   Tobacco Use  . Smoking status: Never Smoker  Substance and Sexual Activity  . Alcohol use: No    Alcohol/week: 0.0 standard drinks  . Drug use: No  . Sexual activity: Not on file  Other Topics Concern  . Not on file  Social History Narrative  . Not on file   Social Determinants of Health   Financial Resource Strain:   . Difficulty of Paying Living Expenses: Not on file  Food Insecurity:   . Worried About Charity fundraiser in the Last Year: Not on file  . Ran Out of Food in the Last Year: Not on file  Transportation Needs:   . Lack of Transportation (Medical): Not on file  . Lack of Transportation (Non-Medical): Not on file  Physical Activity:   . Days of Exercise per Week: Not on file  . Minutes of Exercise per Session: Not on file  Stress:   . Feeling of Stress : Not on file  Social Connections:   . Frequency of Communication with Friends and Family: Not on file  . Frequency of Social Gatherings with Friends and Family: Not on file  . Attends Religious Services: Not on file  . Active Member of Clubs or Organizations: Not on file  . Attends Archivist  Meetings: Not on file  . Marital Status: Not on file  Intimate Partner Violence:   . Fear of Current or Ex-Partner: Not on file  . Emotionally Abused: Not on file  . Physically Abused: Not on file  . Sexually Abused: Not on file    Past Surgical History:  Procedure Laterality Date  . HEMMORRHOIDECTOMY    . LAPAROSCOPIC GASTRIC SLEEVE RESECTION    . ROTATOR CUFF REPAIR    . VASECTOMY        Current Outpatient Medications:  .  albuterol (PROVENTIL HFA;VENTOLIN HFA) 108 (90 Base) MCG/ACT inhaler, Inhale 2 puffs into the lungs every 4 (four) hours as needed for wheezing or shortness of breath (cough, shortness of breath or wheezing.)., Disp: 1 Inhaler, Rfl: 1 .  atorvastatin (LIPITOR) 20 MG tablet, Take by mouth., Disp: , Rfl:  .  atorvastatin (LIPITOR) 40 MG tablet, , Disp: , Rfl:  .  azithromycin  (ZITHROMAX) 250 MG tablet, Take 2 tabs PO x 1 dose, then 1 tab PO QD x 4 days, Disp: 6 tablet, Rfl: 0 .  Cholecalciferol 125 MCG (5000 UT) TABS, Take by mouth., Disp: , Rfl:  .  cyclobenzaprine (FLEXERIL) 10 MG tablet, Take 1 tablet (10 mg total) by mouth at bedtime., Disp: 30 tablet, Rfl: 1 .  Empagliflozin-metFORMIN HCl 02-999 MG TABS, Take by mouth., Disp: , Rfl:  .  L-Methylfolate 15 MG TABS, , Disp: , Rfl:  .  lansoprazole (PREVACID) 30 MG capsule, Take 1 tablet by mouth daily., Disp: , Rfl:  .  levothyroxine (SYNTHROID) 175 MCG tablet, Take by mouth., Disp: , Rfl:  .  levothyroxine (SYNTHROID, LEVOTHROID) 112 MCG tablet, Take 1 tablet (112 mcg total) by mouth daily before breakfast., Disp: 30 tablet, Rfl: 2 .  metFORMIN (GLUCOPHAGE) 1000 MG tablet, Take by mouth., Disp: , Rfl:  .  Multiple Vitamin (MULTIVITAMIN) capsule, Take 1 capsule by mouth daily., Disp: , Rfl:  .  nystatin (MYCOSTATIN/NYSTOP) powder, SMARTSIG:1 Application Topical 2-3 Times Daily, Disp: , Rfl:  .  Omega-3 Fatty Acids (FISH OIL PO), Take by mouth., Disp: , Rfl:  .  testosterone (ANDRODERM) 4 MG/24HR PT24 patch, Place onto the skin., Disp: , Rfl:  .  traZODone (DESYREL) 100 MG tablet, Take by mouth., Disp: , Rfl:  .  vortioxetine HBr (TRINTELLIX) 10 MG TABS tablet, 20 mg., Disp: , Rfl:     Physical Exam: There were no vitals taken for this visit.   Affect appropriate Healthy:  appears stated age 59: normal Neck supple with no adenopathy JVP normal no bruits no thyromegaly Lungs clear with no wheezing and good diaphragmatic motion Heart:  S1/S2 no murmur, no rub, gallop or click PMI normal Abdomen: benighn, BS positve, no tenderness, no AAA no bruit.  No HSM or HJR Distal pulses intact with no bruits No edema Neuro non-focal Skin warm and dry No muscular weakness   Labs:   Lab Results  Component Value Date   WBC 10.4 (A) 08/30/2016   HGB 16.2 08/30/2016   HCT 45.1 08/30/2016   MCV 82.7  08/30/2016   No results for input(s): NA, K, CL, CO2, BUN, CREATININE, CALCIUM, PROT, BILITOT, ALKPHOS, ALT, AST, GLUCOSE in the last 168 hours.  Invalid input(s): LABALBU No results found for: CKTOTAL, CKMB, CKMBINDEX, TROPONINI  Lab Results  Component Value Date   CHOL 261 (H) 10/15/2014   Lab Results  Component Value Date   HDL 58 10/15/2014   Lab Results  Component Value Date  LDLCALC 179 (H) 10/15/2014   Lab Results  Component Value Date   TRIG 121 10/15/2014   Lab Results  Component Value Date   CHOLHDL 4.5 10/15/2014   No results found for: LDLDIRECT    Radiology: No results found.  EKG: SR rate 69 T inversion  Lead 3 otherwise normal    ASSESSMENT AND PLAN:   1. CAD:  Subclinical noted on coronary calcium score. Guidelines suggest perfusion study to be done if score > 400 will order exercise myovue  2. HLD:  On statin f/u primary target LDL with DM and #1 70 or less  3. DM:  Discussed low carb diet.  Target hemoglobin A1c is 6.5 or less.  Continue current medications.  4. Thyroid:  Continue synthroid replacement TSH with primary   Ex. Myovue F/U in a year if normal   Signed: Jenkins Rouge 08/16/2020, 1:43 PM

## 2020-08-17 ENCOUNTER — Other Ambulatory Visit: Payer: Self-pay

## 2020-08-17 ENCOUNTER — Ambulatory Visit: Payer: Federal, State, Local not specified - PPO | Admitting: Cardiovascular Disease

## 2020-08-17 ENCOUNTER — Encounter: Payer: Self-pay | Admitting: Cardiovascular Disease

## 2020-08-17 VITALS — BP 134/82 | HR 69 | Ht 74.75 in | Wt 276.8 lb

## 2020-08-17 DIAGNOSIS — E782 Mixed hyperlipidemia: Secondary | ICD-10-CM

## 2020-08-17 DIAGNOSIS — I25119 Atherosclerotic heart disease of native coronary artery with unspecified angina pectoris: Secondary | ICD-10-CM | POA: Diagnosis not present

## 2020-08-17 NOTE — Patient Instructions (Signed)
Medication Instructions:  *If you need a refill on your cardiac medications before your next appointment, please call your pharmacy*  Lab Work: If you have labs (blood work) drawn today and your tests are completely normal, you will receive your results only by: Marland Kitchen MyChart Message (if you have MyChart) OR . A paper copy in the mail If you have any lab test that is abnormal or we need to change your treatment, we will call you to review the results.  Testing/Procedures: Your physician has requested that you have en exercise stress myoview. For further information please visit [email or web address]. Please follow instruction sheet, as given.  Follow-Up: At [facility], you and your health needs are our priority.  As part of our continuing mission to provide you with exceptional heart care, we have created designated Provider Care Teams.  These Care Teams include your primary Cardiologist (physician) and Advanced Practice Providers (APPs -  Physician Assistants and Nurse Practitioners) who all work together to provide you with the care you need, when you need it.  We recommend signing up for the patient portal called "MyChart".  Sign up information is provided on this After Visit Summary.  MyChart is used to connect with patients for Virtual Visits (Telemedicine).  Patients are able to view lab/test results, encounter notes, upcoming appointments, etc.  Non-urgent messages can be sent to your provider as well.   To learn more about what you can do with MyChart, go to [email or web address].    Your next appointment:   12 month(s)  The format for your next appointment:   In Person  Provider:   You may see Dr. Johnsie Cancel or one of the following Advanced Practice Providers on your designated Care Team:    Truitt Merle, NP  Cecilie Kicks, NP  Kathyrn Drown, NP

## 2020-08-27 NOTE — Addendum Note (Signed)
Addended by: Patterson Hammersmith A on: 08/27/2020 12:51 PM   Modules accepted: Orders

## 2020-08-31 ENCOUNTER — Telehealth: Payer: Self-pay

## 2020-08-31 NOTE — Telephone Encounter (Signed)
Spoke with the patient. Detailed instructions given. He stated that he understood and would be here for his test. Asked to call back with any questions. Nathan Finley EMTP

## 2020-09-03 ENCOUNTER — Other Ambulatory Visit (HOSPITAL_COMMUNITY)
Admission: RE | Admit: 2020-09-03 | Discharge: 2020-09-03 | Disposition: A | Discharge status: Home / Self Care | Payer: Federal, State, Local not specified - PPO | Source: Ambulatory Visit | Attending: Cardiovascular Disease | Admitting: Cardiovascular Disease

## 2020-09-03 DIAGNOSIS — Z01812 Encounter for preprocedural laboratory examination: Secondary | ICD-10-CM | POA: Insufficient documentation

## 2020-09-03 DIAGNOSIS — Z20822 Contact with and (suspected) exposure to covid-19: Secondary | ICD-10-CM | POA: Insufficient documentation

## 2020-09-03 LAB — SARS CORONAVIRUS 2 (TAT 6-24 HRS): SARS Coronavirus 2: NEGATIVE

## 2020-09-06 ENCOUNTER — Telehealth (HOSPITAL_COMMUNITY): Payer: Self-pay

## 2020-09-06 NOTE — Telephone Encounter (Signed)
Spoke with the patient, detailed instructions left on the patient's answering machine. He stated that he would be here for his test. Asked to call back with any questions. S.Blaize Nipper EMTP 

## 2020-09-07 ENCOUNTER — Other Ambulatory Visit: Payer: Self-pay

## 2020-09-07 ENCOUNTER — Ambulatory Visit (HOSPITAL_COMMUNITY): Payer: Federal, State, Local not specified - PPO | Attending: Cardiovascular Disease

## 2020-09-07 DIAGNOSIS — I25119 Atherosclerotic heart disease of native coronary artery with unspecified angina pectoris: Secondary | ICD-10-CM | POA: Diagnosis not present

## 2020-09-07 LAB — MYOCARDIAL PERFUSION IMAGING
Estimated workload: 9.8 METS
Exercise duration (min): 9 min
Exercise duration (sec): 0 s
LV dias vol: 105 mL (ref 62–150)
LV sys vol: 54 mL
MPHR: 160 {beats}/min
Peak HR: 153 {beats}/min
Percent HR: 95 %
Rest HR: 66 {beats}/min
SDS: 0
SRS: 0
SSS: 0
TID: 0.94

## 2020-09-07 MED ORDER — TECHNETIUM TC 99M TETROFOSMIN IV KIT
8.7000 | PACK | Freq: Once | INTRAVENOUS | Status: AC | PRN
  Administered 2020-09-07: 8.7 via INTRAVENOUS
  Filled 2020-09-07: qty 9

## 2020-09-07 MED ORDER — TECHNETIUM TC 99M TETROFOSMIN IV KIT
27.1000 | PACK | Freq: Once | INTRAVENOUS | Status: AC | PRN
  Administered 2020-09-07: 27.1 via INTRAVENOUS
  Filled 2020-09-07: qty 28

## 2020-09-08 ENCOUNTER — Telehealth: Payer: Self-pay

## 2020-09-08 DIAGNOSIS — I25119 Atherosclerotic heart disease of native coronary artery with unspecified angina pectoris: Secondary | ICD-10-CM

## 2020-09-08 DIAGNOSIS — E782 Mixed hyperlipidemia: Secondary | ICD-10-CM

## 2020-09-08 DIAGNOSIS — R9430 Abnormal result of cardiovascular function study, unspecified: Secondary | ICD-10-CM

## 2020-09-08 NOTE — Telephone Encounter (Signed)
-----   Message from Josue Hector, MD sent at 09/08/2020  8:05 AM EST ----- No ischemia good ? EF low f/u echo to assess f/u with me in 6 months

## 2020-09-08 NOTE — Telephone Encounter (Signed)
The patient has been notified of the result and verbalized understanding.  All questions (if any) were answered. Michaelyn Barter, RN 09/08/2020 1:18 PM    Will order echo for patient and put in recall for 6 month follow up.

## 2020-10-11 ENCOUNTER — Other Ambulatory Visit (HOSPITAL_COMMUNITY): Payer: Federal, State, Local not specified - PPO

## 2020-10-11 ENCOUNTER — Encounter (HOSPITAL_COMMUNITY): Payer: Self-pay | Admitting: Cardiovascular Disease

## 2020-11-03 ENCOUNTER — Other Ambulatory Visit: Payer: Self-pay

## 2020-11-03 ENCOUNTER — Ambulatory Visit (HOSPITAL_COMMUNITY): Payer: Federal, State, Local not specified - PPO | Attending: Internal Medicine

## 2020-11-03 DIAGNOSIS — R9430 Abnormal result of cardiovascular function study, unspecified: Secondary | ICD-10-CM | POA: Diagnosis not present

## 2020-11-03 DIAGNOSIS — E782 Mixed hyperlipidemia: Secondary | ICD-10-CM | POA: Diagnosis not present

## 2020-11-03 DIAGNOSIS — I25119 Atherosclerotic heart disease of native coronary artery with unspecified angina pectoris: Secondary | ICD-10-CM

## 2020-11-03 LAB — ECHOCARDIOGRAM COMPLETE
Area-P 1/2: 2.37 cm2
S' Lateral: 3.6 cm

## 2020-12-17 DIAGNOSIS — E039 Hypothyroidism, unspecified: Secondary | ICD-10-CM | POA: Diagnosis not present

## 2020-12-17 DIAGNOSIS — E1169 Type 2 diabetes mellitus with other specified complication: Secondary | ICD-10-CM | POA: Diagnosis not present

## 2020-12-31 ENCOUNTER — Ambulatory Visit
Admission: RE | Admit: 2020-12-31 | Discharge: 2020-12-31 | Disposition: A | Discharge status: Home / Self Care | Payer: Federal, State, Local not specified - PPO | Source: Ambulatory Visit | Attending: Internal Medicine | Admitting: Internal Medicine

## 2020-12-31 DIAGNOSIS — R911 Solitary pulmonary nodule: Secondary | ICD-10-CM

## 2020-12-31 DIAGNOSIS — R918 Other nonspecific abnormal finding of lung field: Secondary | ICD-10-CM | POA: Diagnosis not present

## 2021-02-17 DIAGNOSIS — Z1211 Encounter for screening for malignant neoplasm of colon: Secondary | ICD-10-CM | POA: Diagnosis not present

## 2021-02-17 DIAGNOSIS — K5730 Diverticulosis of large intestine without perforation or abscess without bleeding: Secondary | ICD-10-CM | POA: Diagnosis not present

## 2021-02-17 DIAGNOSIS — Z86010 Personal history of colonic polyps: Secondary | ICD-10-CM | POA: Diagnosis not present

## 2021-02-17 DIAGNOSIS — K219 Gastro-esophageal reflux disease without esophagitis: Secondary | ICD-10-CM | POA: Diagnosis not present

## 2021-02-18 NOTE — Progress Notes (Signed)
CARDIOLOGY CONSULT NOTE       Patient ID: Nathan Finley MRN: [id number] DOB/AGE: [birth year] 61 y.o.  Admit date: (Not on file) Referring Physician: Perini Primary Physician: Crist Infante, MD Primary Cardiologist: Johnsie Cancel Reason for Consultation: CAD  Active Problems:   * No active hospital problems. *   HPI:  60 y.o. with history of HLD, DM, hypothyroidism first seen 08/17/20 for CAD Reviewed his coronary calcium score done 07/02/20 and read by Radiology. 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 He is trying to exercise some at [employer] No chest pains   F/U Myovue 09/07/20 showed no ischemia or infarct EF estimate 48% TTE done 11/03/20 EF 55-60% no valve disease. Ao 4.3 cm  Had f/u non contrast CT 12/11/20 aorta non aneurysmal  Had multiple stable nodules 6-9 mm latter in right middle lobe   He smokes THC occasionally Going to gym No chest pain  Lots of questions about prognosis   Started on Repatha by primary in addition to statin a month ago by primary    ROS All other systems reviewed and negative except as noted above  Past Medical History:  Diagnosis Date  . Anal fissure   . Anxiety   . Benign neoplasm   . Diabetes mellitus without complication ([place])   . Golfer's elbow   . Hemorrhoids   . Hyperlipidemia   . Hypothyroidism   . Migraine with aura   . Morbid obesity ([place])   . Obesity   . OSA (obstructive sleep apnea)   . Tennis elbow   . Testicular hypofunction   . Thyroid disease     hypothyroidism  . Toe pain     Family History  Problem Relation Age of Onset  . High Cholesterol Mother   . Heart disease Father   . Cancer Maternal Grandmother     Social History   Socioeconomic History  . Marital status: Married    Spouse name: Not on file  . Number of children: Not on file  . Years of education: Not on file  . Highest education level: Not on file  Occupational History  . Not on file  Tobacco Use  . Smoking status: Former Research scientist (life sciences)  . Smokeless tobacco: Never Used  Substance and Sexual Activity  . Alcohol use: No    Alcohol/week: 0.0 standard drinks  . Drug use: No  . Sexual activity: Not on file  Other Topics Concern  . Not on file  Social History Narrative  . Not on file   Social Determinants of Health   Financial Resource Strain: Not on file  Food Insecurity: Not on file  Transportation Needs: Not on file  Physical Activity: Not on file  Stress: Not on file  Social Connections: Not on file  Intimate Partner Violence: Not on file    Past Surgical History:  Procedure Laterality Date  . HEMMORRHOIDECTOMY    . LAPAROSCOPIC GASTRIC SLEEVE RESECTION    . ROTATOR CUFF REPAIR    .  VASECTOMY        Current Outpatient Medications:  .  albuterol (PROVENTIL HFA;VENTOLIN HFA) 108 (90 Base) MCG/ACT inhaler, Inhale 2 puffs into the lungs every 4 (four) hours as needed for wheezing or shortness of breath (cough, shortness of breath or wheezing.)., Disp: 1 Inhaler, Rfl: 1 .  atorvastatin (LIPITOR) 20 MG tablet, Take 20 mg by mouth at bedtime., Disp: , Rfl:  .  Cholecalciferol 125 MCG (5000 UT) TABS, Take 1 tablet by mouth as directed. , Disp: , Rfl:  .  cyclobenzaprine (FLEXERIL) 10 MG tablet, Take 1 tablet (10 mg total) by mouth at bedtime., Disp: 30 tablet, Rfl: 1 .  Empagliflozin-metFORMIN HCl 02-999 MG TABS, Take 1 tablet by mouth as directed. , Disp: , Rfl:  .  L-Methylfolate 15 MG TABS, Take 15 mg by mouth as directed. , Disp: , Rfl:  .  lansoprazole  (PREVACID) 30 MG capsule, Take 1 tablet by mouth daily., Disp: , Rfl:  .  levothyroxine (SYNTHROID) 175 MCG tablet, Take 175 mcg by mouth daily before breakfast. , Disp: , Rfl:  .  Multiple Vitamin (MULTIVITAMIN) capsule, Take 1 capsule by mouth daily., Disp: , Rfl:  .  nystatin (MYCOSTATIN/NYSTOP) powder, SMARTSIG:1 Application Topical 2-3 Times Daily, Disp: , Rfl:  .  OZEMPIC, 1 MG/DOSE, 4 MG/3ML SOPN, Inject 1 mg into the skin once a week., Disp: , Rfl:  .  REPATHA SURECLICK 998 MG/ML SOAJ, Inject 140 mg into the skin every 14 (fourteen) days., Disp: , Rfl:  .  traZODone (DESYREL) 100 MG tablet, Take 100 mg by mouth as directed. , Disp: , Rfl:  .  UBRELVY 100 MG TABS, Take 100 mg by mouth as directed. , Disp: , Rfl:     Physical Exam: Blood pressure (!) 144/86, pulse 64, height 6\' 2"  (1.88 m), weight 120.7 kg, SpO2 98 %.   Affect appropriate Healthy:  appears stated age 84: normal Neck supple with no adenopathy JVP normal no bruits no thyromegaly Lungs clear with no wheezing and good diaphragmatic motion Heart:  S1/S2 no murmur, no rub, gallop or click PMI normal Abdomen: benighn, BS positve, no tenderness, no AAA no bruit.  No HSM or HJR Distal pulses intact with no bruits No edema Neuro non-focal Skin warm and dry No muscular weakness   Labs:   Lab Results  Component Value Date   WBC 10.4 (A) 08/30/2016   HGB 16.2 08/30/2016   HCT 45.1 08/30/2016   MCV 82.7 08/30/2016   No results for input(s): NA, K, CL, CO2, BUN, CREATININE, CALCIUM, PROT, BILITOT, ALKPHOS, ALT, AST, GLUCOSE in the last 168 hours.  Invalid input(s): LABALBU No results found for: CKTOTAL, CKMB, CKMBINDEX, TROPONINI  Lab Results  Component Value Date   CHOL 261 (H) 10/15/2014   Lab Results  Component Value Date   HDL 58 10/15/2014   Lab Results  Component Value Date   LDLCALC 179 (H) 10/15/2014   Lab Results  Component Value Date   TRIG 121 10/15/2014   Lab Results  Component Value  Date   CHOLHDL 4.5 10/15/2014   No results found for: LDLDIRECT    Radiology: No results found.  EKG: SR rate 69 T inversion  Lead 3 otherwise normal    ASSESSMENT AND PLAN:   1. CAD:  Subclinical noted on coronary calcium score. Normal myovue 09/07/20 achieved 9.8 METS continue statin and repatha per Dr Joylene Draft LdL goal 55 or less   2. HLD:  On Repatha  LDL <55  given CAD, DM  3. DM:  Discussed low carb diet.  Target hemoglobin A1c is 6.5 or less.  Continue current medications.  4. Thyroid:  Continue synthroid replacement TSH with primary   5. Pulmonary Nodules stable by CT 12/31/20 largest in RML 9 mm rest Are <6 mm   F/U in a year   Signed: Jenkins Rouge 02/25/2021, 9:31 AM

## 2021-02-25 ENCOUNTER — Ambulatory Visit: Payer: Federal, State, Local not specified - PPO | Admitting: Cardiovascular Disease

## 2021-02-25 ENCOUNTER — Other Ambulatory Visit: Payer: Self-pay

## 2021-02-25 ENCOUNTER — Encounter: Payer: Self-pay | Admitting: Cardiovascular Disease

## 2021-02-25 VITALS — BP 144/86 | HR 64 | Ht 74.0 in | Wt 266.0 lb

## 2021-02-25 DIAGNOSIS — I2510 Atherosclerotic heart disease of native coronary artery without angina pectoris: Secondary | ICD-10-CM | POA: Diagnosis not present

## 2021-02-25 DIAGNOSIS — E782 Mixed hyperlipidemia: Secondary | ICD-10-CM | POA: Diagnosis not present

## 2021-02-25 DIAGNOSIS — R911 Solitary pulmonary nodule: Secondary | ICD-10-CM | POA: Diagnosis not present

## 2021-02-25 DIAGNOSIS — E039 Hypothyroidism, unspecified: Secondary | ICD-10-CM

## 2021-02-25 NOTE — Patient Instructions (Signed)
Medication Instructions:  ?NO CHANGES ?*If you need a refill on your cardiac medications before your next appointment, please call your pharmacy* ? ? ?Lab Work: ?NONE ?If you have labs (blood work) drawn today and your tests are completely normal, you will receive your results only by: ?MyChart Message (if you have MyChart) OR ?A paper copy in the mail ?If you have any lab test that is abnormal or we need to change your treatment, we will call you to review the results. ? ? ?Testing/Procedures: ?NONE ? ? ?Follow-Up: ?At [facility], you and your health needs are our priority.  As part of our continuing mission to provide you with exceptional heart care, we have created designated Provider Care Teams.  These Care Teams include your primary Cardiologist (physician) and Advanced Practice Providers (APPs -  Physician Assistants and Nurse Practitioners) who all work together to provide you with the care you need, when you need it. ? ?We recommend signing up for the patient portal called "MyChart".  Sign up information is provided on this After Visit Summary.  MyChart is used to connect with patients for Virtual Visits (Telemedicine).  Patients are able to view lab/test results, encounter notes, upcoming appointments, etc.  Non-urgent messages can be sent to your provider as well.   ?To learn more about what you can do with MyChart, go to [email or web address].   ? ?Your next appointment:   ?1 year(s) ? ?The format for your next appointment:   ?In Person ? ?Provider:   ?Peter Nishan, MD   ? ? ?Other Instructions ?NONE  ?

## 2021-03-11 DIAGNOSIS — K219 Gastro-esophageal reflux disease without esophagitis: Secondary | ICD-10-CM | POA: Diagnosis not present

## 2021-03-11 DIAGNOSIS — K449 Diaphragmatic hernia without obstruction or gangrene: Secondary | ICD-10-CM | POA: Diagnosis not present

## 2021-03-11 DIAGNOSIS — K635 Polyp of colon: Secondary | ICD-10-CM | POA: Diagnosis not present

## 2021-03-11 DIAGNOSIS — Z1211 Encounter for screening for malignant neoplasm of colon: Secondary | ICD-10-CM | POA: Diagnosis not present

## 2021-03-11 DIAGNOSIS — D120 Benign neoplasm of cecum: Secondary | ICD-10-CM | POA: Diagnosis not present

## 2021-07-13 DIAGNOSIS — G43B0 Ophthalmoplegic migraine, not intractable: Secondary | ICD-10-CM | POA: Diagnosis not present

## 2021-07-13 DIAGNOSIS — H538 Other visual disturbances: Secondary | ICD-10-CM | POA: Diagnosis not present

## 2021-07-13 DIAGNOSIS — E119 Type 2 diabetes mellitus without complications: Secondary | ICD-10-CM | POA: Diagnosis not present

## 2021-07-22 DIAGNOSIS — E291 Testicular hypofunction: Secondary | ICD-10-CM | POA: Diagnosis not present

## 2021-07-22 DIAGNOSIS — E1169 Type 2 diabetes mellitus with other specified complication: Secondary | ICD-10-CM | POA: Diagnosis not present

## 2021-07-22 DIAGNOSIS — E039 Hypothyroidism, unspecified: Secondary | ICD-10-CM | POA: Diagnosis not present

## 2021-07-22 DIAGNOSIS — E785 Hyperlipidemia, unspecified: Secondary | ICD-10-CM | POA: Diagnosis not present

## 2021-07-22 DIAGNOSIS — Z125 Encounter for screening for malignant neoplasm of prostate: Secondary | ICD-10-CM | POA: Diagnosis not present

## 2021-07-29 DIAGNOSIS — E1169 Type 2 diabetes mellitus with other specified complication: Secondary | ICD-10-CM | POA: Diagnosis not present

## 2021-07-29 DIAGNOSIS — Z1331 Encounter for screening for depression: Secondary | ICD-10-CM | POA: Diagnosis not present

## 2021-07-29 DIAGNOSIS — Z23 Encounter for immunization: Secondary | ICD-10-CM | POA: Diagnosis not present

## 2021-07-29 DIAGNOSIS — E039 Hypothyroidism, unspecified: Secondary | ICD-10-CM | POA: Diagnosis not present

## 2021-07-29 DIAGNOSIS — Z0000 Encounter for general adult medical examination without abnormal findings: Secondary | ICD-10-CM | POA: Diagnosis not present

## 2021-07-29 DIAGNOSIS — R82998 Other abnormal findings in urine: Secondary | ICD-10-CM | POA: Diagnosis not present

## 2021-08-02 ENCOUNTER — Other Ambulatory Visit: Payer: Self-pay | Admitting: Internal Medicine

## 2021-08-02 DIAGNOSIS — R918 Other nonspecific abnormal finding of lung field: Secondary | ICD-10-CM

## 2021-08-02 DIAGNOSIS — H532 Diplopia: Secondary | ICD-10-CM

## 2021-08-22 ENCOUNTER — Ambulatory Visit
Admission: RE | Admit: 2021-08-22 | Discharge: 2021-08-22 | Disposition: A | Discharge status: Home / Self Care | Payer: Federal, State, Local not specified - PPO | Source: Ambulatory Visit | Attending: Internal Medicine | Admitting: Internal Medicine

## 2021-08-22 ENCOUNTER — Other Ambulatory Visit: Payer: Self-pay

## 2021-08-22 DIAGNOSIS — H532 Diplopia: Secondary | ICD-10-CM

## 2021-08-22 DIAGNOSIS — G43109 Migraine with aura, not intractable, without status migrainosus: Secondary | ICD-10-CM | POA: Diagnosis not present

## 2021-08-22 MED ORDER — GADOBENATE DIMEGLUMINE 529 MG/ML IV SOLN
20.0000 mL | Freq: Once | INTRAVENOUS | Status: AC | PRN
  Administered 2021-08-22: 20 mL via INTRAVENOUS

## 2021-09-09 DIAGNOSIS — Z1211 Encounter for screening for malignant neoplasm of colon: Secondary | ICD-10-CM | POA: Diagnosis not present

## 2021-09-09 DIAGNOSIS — K5730 Diverticulosis of large intestine without perforation or abscess without bleeding: Secondary | ICD-10-CM | POA: Diagnosis not present

## 2022-01-24 ENCOUNTER — Ambulatory Visit: Payer: Federal, State, Local not specified - PPO | Admitting: Podiatrist

## 2022-01-24 ENCOUNTER — Encounter: Payer: Self-pay | Admitting: Podiatrist

## 2022-01-24 ENCOUNTER — Ambulatory Visit (INDEPENDENT_AMBULATORY_CARE_PROVIDER_SITE_OTHER): Payer: Federal, State, Local not specified - PPO

## 2022-01-24 DIAGNOSIS — M7750 Other enthesopathy of unspecified foot: Secondary | ICD-10-CM

## 2022-01-24 DIAGNOSIS — M7751 Other enthesopathy of right foot: Secondary | ICD-10-CM | POA: Diagnosis not present

## 2022-01-24 MED ORDER — TRIAMCINOLONE ACETONIDE 10 MG/ML IJ SUSP: 10.0000 mg | Freq: Once | INTRAMUSCULAR | Status: AC

## 2022-01-24 NOTE — Patient Instructions (Signed)
Give the injection a few days to set in and see how your foot feels.   ?Let us know if your pain fails to subside. ? ?I do think you would benefit from a custom orthotic for the long term. ? ?You can call your insurance and provide the cpt code and diagnosis code and they will be able to tell you your coverage information. ? ?If you would like to be casted/ order some orthotics,  call our office at [phone] ?And ask to be put on the schedule to see Aaron Edelman- our pedorthist and he will take good care of you.   ?

## 2022-01-24 NOTE — Progress Notes (Signed)
?Chief Complaint  ?Patient presents with  ? Foot Pain  ?  right side of foot pain for last 3 wks  ?  ? ?HPI: Patient is 61 y.o. male who presents today for pain along the lateral aspect of the right foot for about 3 weeks duration.  He states he noticed the pain increasing as he was walking on his treadmill.  He points to the lateral aspect of the fifth metatarsal base and plantar aspect as the areas of pain.  He denies any self treatment and states that the pain is continuing to get worse ? ?Patient Active Problem List  ? Diagnosis Date Noted  ? Obesity, Class I, BMI 30-34.9 07/27/2017  ? Bulging of intervertebral disc between L4 and L5 12/12/2016  ? History of sleeve gastrectomy 05/09/2016  ? Combined hyperlipidemia associated with type 2 diabetes mellitus ([place]) 03/13/2016  ? Colon polyp 02/09/2016  ? Hypothyroidism (acquired) 02/09/2016  ? Controlled type 2 diabetes mellitus without complication, without long-term current use of insulin ([place]) 11/22/2013  ? Hypogonadal obesity 11/22/2013  ? Osteopenia 11/22/2013  ? Dysthymia 10/09/1966  ? PTSD (post-traumatic stress disorder) 10/09/1966  ? ? ?Current Outpatient Medications on File Prior to Visit  ?Medication Sig Dispense Refill  ? albuterol (PROVENTIL HFA;VENTOLIN HFA) 108 (90 Base) MCG/ACT inhaler Inhale 2 puffs into the lungs every 4 (four) hours as needed for wheezing or shortness of breath (cough, shortness of breath or wheezing.). 1 Inhaler 1  ? atorvastatin (LIPITOR) 20 MG tablet Take 20 mg by mouth at bedtime.    ? Cholecalciferol 125 MCG (5000 UT) TABS Take 1 tablet by mouth as directed.     ? cyclobenzaprine (FLEXERIL) 10 MG tablet Take 1 tablet (10 mg total) by mouth at bedtime. 30 tablet 1  ? Empagliflozin-metFORMIN HCl 02-999 MG TABS Take 1 tablet by mouth as directed.     ? L-Methylfolate 15 MG TABS Take 15 mg by mouth as directed.     ? lansoprazole (PREVACID) 30 MG capsule Take 1 tablet by mouth daily.    ? levothyroxine (SYNTHROID) 175 MCG tablet  Take 175 mcg by mouth daily before breakfast.     ? Multiple Vitamin (MULTIVITAMIN) capsule Take 1 capsule by mouth daily.    ? nystatin (MYCOSTATIN/NYSTOP) powder SMARTSIG:1 Application Topical 2-3 Times Daily    ? OZEMPIC, 1 MG/DOSE, 4 MG/3ML SOPN Inject 1 mg into the skin once a week.    ? REPATHA SURECLICK 458 MG/ML SOAJ Inject 140 mg into the skin every 14 (fourteen) days.    ? traZODone (DESYREL) 100 MG tablet Take 100 mg by mouth as directed.     ? UBRELVY 100 MG TABS Take 100 mg by mouth as directed.     ? ?No current facility-administered medications on file prior to visit.  ? ? ?Allergies  ?Allergen Reactions  ? Iodine Hives  ? Iodine-131 Rash  ? ? ?Review of Systems ?No fevers, chills, nausea, muscle aches, no difficulty breathing, no calf pain, no chest pain or shortness of breath. ? ? ?Physical Exam ? ?GENERAL APPEARANCE: Alert, conversant. Appropriately groomed. No acute distress.  ? ?VASCULAR: Pedal pulses palpable DP and PT bilateral.  Capillary refill time is immediate to all digits,  Proximal to distal cooling it warm to warm.  Digital perfusion adequate.  ? ?NEUROLOGIC: sensation is intact to 5.07 monofilament at 5/5 sites bilateral.  Light touch is intact bilateral, vibratory sensation intact bilateral ? ?MUSCULOSKELETAL: acceptable muscle strength, tone and stability bilateral.  Forefoot  cavus with mild contracture of the hallux and digits is noted.  Swelling at the fifth metatarsal base laterally and plantarly is noted.  Peroneal tendon along the course and its insertion is intact no pain along the peroneal tendons noted. ? ?DERMATOLOGIC: skin is warm, supple, and dry.  Color, texture, and turgor of skin within normal limits.  No open wounds are noted.  No preulcerative lesions are seen.  Digital nails are asymptomatic.   ? ?X-ray evaluation: 3 views of the right foot are obtained.  No sign of fracture or dislocation is noted.  No acute osseous abnormalities are seen.  A prominent fifth  metatarsal base is noted on the oblique and AP views.  Swelling around the fifth metatarsal base is also seen. ? ?Assessment  ? ?  ICD-10-CM   ?1. Tendonitis of ankle or foot  M77.50 DG Foot Complete Right  ?  ?2. Bursitis of right foot  M77.51   ?  ? ? ? ?Plan ? ?Discussed exam and x-ray findings with the patient.  It does appear to be an inflamed bursa and I recommended an injection of steroid.  The patient agreed I prepped the skin with alcohol and infiltrated 5 mg of Kenalog with 0.5% Marcaine plain being careful to stay away from the tendon insertion.  The patient tolerated this well.  Dispensed an ankle compression brace.  I also discussed the long-term positive benefits of orthotics and gave him an orthotic form to call his insurance for coverage.  If he is interested in having orthotics he will let us know and be set up to see Aaron Edelman.  Otherwise he is to see how he does with the injection and will call if it fails to relieve his pain. ?

## 2022-02-01 DIAGNOSIS — E785 Hyperlipidemia, unspecified: Secondary | ICD-10-CM | POA: Diagnosis not present

## 2022-02-01 DIAGNOSIS — E039 Hypothyroidism, unspecified: Secondary | ICD-10-CM | POA: Diagnosis not present

## 2022-02-01 DIAGNOSIS — E1169 Type 2 diabetes mellitus with other specified complication: Secondary | ICD-10-CM | POA: Diagnosis not present

## 2022-02-01 DIAGNOSIS — Z23 Encounter for immunization: Secondary | ICD-10-CM | POA: Diagnosis not present

## 2022-02-07 ENCOUNTER — Ambulatory Visit (INDEPENDENT_AMBULATORY_CARE_PROVIDER_SITE_OTHER): Payer: Federal, State, Local not specified - PPO

## 2022-02-07 ENCOUNTER — Ambulatory Visit: Payer: Federal, State, Local not specified - PPO | Admitting: Podiatry

## 2022-02-07 ENCOUNTER — Other Ambulatory Visit: Payer: Self-pay | Admitting: Podiatry

## 2022-02-07 ENCOUNTER — Encounter: Payer: Self-pay | Admitting: Podiatry

## 2022-02-07 DIAGNOSIS — M84374A Stress fracture, right foot, initial encounter for fracture: Secondary | ICD-10-CM | POA: Diagnosis not present

## 2022-02-07 DIAGNOSIS — M7751 Other enthesopathy of right foot: Secondary | ICD-10-CM

## 2022-02-07 DIAGNOSIS — M7752 Other enthesopathy of left foot: Secondary | ICD-10-CM

## 2022-02-07 DIAGNOSIS — M84374G Stress fracture, right foot, subsequent encounter for fracture with delayed healing: Secondary | ICD-10-CM

## 2022-02-07 DIAGNOSIS — M2041 Other hammer toe(s) (acquired), right foot: Secondary | ICD-10-CM

## 2022-02-07 DIAGNOSIS — M7750 Other enthesopathy of unspecified foot: Secondary | ICD-10-CM

## 2022-02-07 DIAGNOSIS — M79671 Pain in right foot: Secondary | ICD-10-CM

## 2022-02-07 NOTE — Progress Notes (Signed)
SITUATION ?Reason for Consult: Evaluation for Bilateral Custom Foot Orthoses ?Patient / Caregiver Report: Patient is ready for foot orthotics ? ?OBJECTIVE DATA: ?Patient History / Diagnosis:  ?  ICD-10-CM   ?1. Tendonitis of ankle or foot  M77.50   ?  ?2. Bursitis of right foot  M77.51   ?  ?3. Acquired hammertoe of right foot  M20.41   ?  ? ? ?Current or Previous Devices:   None and no history ? ?Foot Examination: ?Skin presentation:   Intact ?Ulcers & Callousing:   None ?Toe / Foot Deformities:  Hammertoes ?Weight Bearing Presentation:  Rectus ?Sensation:    Intact ? ?Shoe Size:    15XW ? ?ORTHOTIC RECOMMENDATION ?Recommended Device: 1x pair of custom functional foot orthotics ? ?GOALS OF ORTHOSES ?- Reduce Pain ?- Prevent Foot Deformity ?- Prevent Progression of Further Foot Deformity ?- Relieve Pressure ?- Improve the Overall Biomechanical Function of the Foot and Lower Extremity. ? ?ACTIONS PERFORMED ?Potential out of pocket cost was communicated to patient. Patient understood and consent to casting. Patient was casted for Foot Orthoses via crush box. Procedure was explained and patient tolerated procedure well. Casts were shipped to central fabrication. All questions were answered and concerns addressed. ? ?PLAN ?Patient is to be called for fitting when devices are ready.  ? ? ?

## 2022-02-08 ENCOUNTER — Ambulatory Visit
Admission: RE | Admit: 2022-02-08 | Discharge: 2022-02-08 | Disposition: A | Discharge status: Home / Self Care | Payer: Federal, State, Local not specified - PPO | Source: Ambulatory Visit | Attending: Internal Medicine | Admitting: Internal Medicine

## 2022-02-08 DIAGNOSIS — R918 Other nonspecific abnormal finding of lung field: Secondary | ICD-10-CM | POA: Diagnosis not present

## 2022-02-08 DIAGNOSIS — R911 Solitary pulmonary nodule: Secondary | ICD-10-CM | POA: Diagnosis not present

## 2022-02-12 NOTE — Progress Notes (Signed)
?  Subjective:  ?Patient ID: Nathan Finley, male    DOB: 18-Jan-1961,  MRN: [id number] ? ?Chief Complaint  ?Patient presents with  ? Foot Pain  ?  Pain to right foot- seen by provider on 01/24/2022 and pt was given a shot of Kenalog- shot was effective but pain has returned. Pt described pain as a burning, throbbing sensation. Onset of complaint- over a month.   ? ? ?61 y.o. male presents with the above complaint. History confirmed with patient.  ? ?Objective:  ?Physical Exam: ?warm, good capillary refill, no trophic changes or ulcerative lesions, normal DP and PT pulses, normal sensory exam, and pain along the fourth and fifth metatarsal.  No gross instability ? ?Radiographs: ?Multiple views x-ray of the right foot: Radiographs taken today show no obvious stress fracture. ?Assessment:  ? ?1. Stress reaction of right foot, initial encounter   ?2. Pain in right foot   ? ? ? ?Plan:  ?Patient was evaluated and treated and all questions answered. ? ?We reviewed today's radiographs and my clinical findings in detail.  He was also fitted for custom molded orthoses today by our orthotist which I think will help him greatly long-term.  Currently continues to have fairly consistent pain and injection therapy was not helpful.  I recommend immobilization in a cam boot for support we discussed the possibility of early stress reaction or stress fracture there is nothing evident on plain film x-rays.  We discussed that an MRI may show more detail but likely would not change management at this point.  I also recommended evaluation with lab work and an arthritis panel was ordered to ensure this is not gout.  I will see him back in about 1 month to see how he is doing, if not improved by that point I likely would recommend MRI of the foot. ? ?Return in about 1 month (around 03/10/2022) for right foot pain f/u, new xrays if still having pain .  ? ?

## 2022-03-02 ENCOUNTER — Telehealth: Payer: Self-pay

## 2022-03-02 NOTE — Telephone Encounter (Signed)
Foot orthotics ready for pickup - LVM

## 2022-03-07 ENCOUNTER — Ambulatory Visit: Payer: Federal, State, Local not specified - PPO | Admitting: Podiatry

## 2022-03-27 ENCOUNTER — Ambulatory Visit: Payer: Federal, State, Local not specified - PPO

## 2022-03-27 DIAGNOSIS — M7751 Other enthesopathy of right foot: Secondary | ICD-10-CM

## 2022-03-27 DIAGNOSIS — M7750 Other enthesopathy of unspecified foot: Secondary | ICD-10-CM

## 2022-03-27 DIAGNOSIS — M2041 Other hammer toe(s) (acquired), right foot: Secondary | ICD-10-CM

## 2022-03-27 NOTE — Progress Notes (Signed)
SITUATION: Reason for Visit: Fitting and Delivery of Custom Fabricated Foot Orthoses Patient Report: Patient reports comfort and is satisfied with device.  OBJECTIVE DATA: Patient History / Diagnosis:     ICD-10-CM   1. Tendonitis of ankle or foot  M77.50     2. Bursitis of right foot  M77.51     3. Acquired hammertoe of right foot  M20.41       Provided Device:  Custom Functional Foot Orthotics     [facility]: [id number]  GOAL OF ORTHOSIS - Improve gait - Decrease energy expenditure - Improve Balance - Provide Triplanar stability of foot complex - Facilitate motion  ACTIONS PERFORMED Patient was fit with foot orthotics trimmed to shoe last. Patient tolerated fittign procedure.   Patient was provided with verbal and written instruction and demonstration regarding donning, doffing, wear, care, proper fit, function, purpose, cleaning, and use of the orthosis and in all related precautions and risks and benefits regarding the orthosis.  Patient was also provided with verbal instruction regarding how to report any failures or malfunctions of the orthosis and necessary follow up care. Patient was also instructed to contact our office regarding any change in status that may affect the function of the orthosis.  Patient demonstrated independence with proper donning, doffing, and fit and verbalized understanding of all instructions.  PLAN: Patient is to follow up in one week or as necessary (PRN). All questions were answered and concerns addressed. Plan of care was discussed with and agreed upon by the patient.

## 2022-04-24 ENCOUNTER — Ambulatory Visit: Payer: Federal, State, Local not specified - PPO | Admitting: Podiatry

## 2022-05-17 DIAGNOSIS — M545 Low back pain, unspecified: Secondary | ICD-10-CM | POA: Diagnosis not present

## 2022-05-18 DIAGNOSIS — M5416 Radiculopathy, lumbar region: Secondary | ICD-10-CM | POA: Diagnosis not present

## 2022-05-19 DIAGNOSIS — K5730 Diverticulosis of large intestine without perforation or abscess without bleeding: Secondary | ICD-10-CM | POA: Diagnosis not present

## 2022-05-19 DIAGNOSIS — Z1211 Encounter for screening for malignant neoplasm of colon: Secondary | ICD-10-CM | POA: Diagnosis not present

## 2022-05-24 DIAGNOSIS — M5451 Vertebrogenic low back pain: Secondary | ICD-10-CM | POA: Diagnosis not present

## 2022-05-29 DIAGNOSIS — M545 Low back pain, unspecified: Secondary | ICD-10-CM | POA: Diagnosis not present

## 2022-06-05 DIAGNOSIS — M545 Low back pain, unspecified: Secondary | ICD-10-CM | POA: Diagnosis not present

## 2022-06-13 DIAGNOSIS — M5451 Vertebrogenic low back pain: Secondary | ICD-10-CM | POA: Diagnosis not present

## 2022-07-10 DIAGNOSIS — M5416 Radiculopathy, lumbar region: Secondary | ICD-10-CM | POA: Diagnosis not present

## 2022-07-25 DIAGNOSIS — M549 Dorsalgia, unspecified: Secondary | ICD-10-CM | POA: Diagnosis not present

## 2022-07-31 NOTE — Progress Notes (Signed)
CARDIOLOGY CONSULT NOTE       Patient ID: Nathan Finley MRN: [id number] DOB/AGE: 1961-03-22 61 y.o.  Admit date: (Not on file) Referring Physician: Perini Primary Physician: Crist Infante, MD Primary Cardiologist: Johnsie Cancel Reason for Consultation: CAD    HPI:  61 y.o. with history of HLD, DM, hypothyroidism first seen 08/17/20 for CAD Reviewed his coronary calcium score done 07/02/20 and read by Radiology. Total score 474 which was 42 th percentile for age and sex Calcium involved all 3 majir epicardial coronary arteries He is on lipitor with dose increase to 40 mg July 2021 Has low T but replacement caused elevated Hb now off it since 2018 Has had previous gastric sleeve for weight loss Has 3 cocktails/ day He is a disability judge. 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 He is trying to exercise some at [employer] No chest pains   F/U Myovue 09/07/20 showed no ischemia or infarct EF estimate 48% TTE done 11/03/20 EF 55-60% no valve disease. Ao 4.3 cm  Had f/u non contrast CT 12/11/20 aorta non aneurysmal  Had multiple stable nodules 6-9 mm latter in right middle lobe   He smokes THC occasionally Going to gym No chest pain  Lots of questions about prognosis   Started on Repatha by primary in addition to statin However due to insurance issues unable to get and on Zetia now instead  May had back issues made him more sedentary No better and going to gym  No chest pain    ROS All other systems reviewed and negative except as noted above  Past Medical History:  Diagnosis Date   Anal fissure    Anxiety    Benign neoplasm    Diabetes mellitus without complication (HCC)    Golfer's elbow    Hemorrhoids    Hyperlipidemia    Hypothyroidism    Migraine with aura    Morbid obesity (HCC)    Obesity    OSA (obstructive sleep apnea)    Tennis  elbow    Testicular hypofunction    Thyroid disease    hypothyroidism   Toe pain     Family History  Problem Relation Age of Onset   High Cholesterol Mother    Heart disease Father    Cancer Maternal Grandmother     Social History   Socioeconomic History   Marital status: Married    Spouse name: Not on file   Number of children: Not on file   Years of education: Not on file   Highest education level: Not on file  Occupational History   Not on file  Tobacco Use   Smoking status: Former   Smokeless tobacco: Never  Substance and Sexual Activity   Alcohol use: No    Alcohol/week: 0.0 standard drinks of alcohol   Drug use: No   Sexual activity: Not on file  Other Topics Concern   Not on file  Social History Narrative   Not on file   Social Determinants of Health   Financial Resource Strain: Not on file  Food Insecurity: Not on file  Transportation Needs: Not on file  Physical Activity: Not on file  Stress: Not on file  Social Connections: Not on file  Intimate Partner Violence: Not on file    Past Surgical History:  Procedure Laterality Date   HEMMORRHOIDECTOMY  LAPAROSCOPIC GASTRIC SLEEVE RESECTION     ROTATOR CUFF REPAIR     VASECTOMY        Current Outpatient Medications:    albuterol (PROVENTIL HFA;VENTOLIN HFA) 108 (90 Base) MCG/ACT inhaler, Inhale 2 puffs into the lungs every 4 (four) hours as needed for wheezing or shortness of breath (cough, shortness of breath or wheezing.)., Disp: 1 Inhaler, Rfl: 1   atorvastatin (LIPITOR) 40 MG tablet, Take 40 mg by mouth daily., Disp: , Rfl:    Cholecalciferol 125 MCG (5000 UT) TABS, Take 1 tablet by mouth as directed. , Disp: , Rfl:    cyclobenzaprine (FLEXERIL) 10 MG tablet, Take 1 tablet (10 mg total) by mouth at bedtime. (Patient taking differently: Take 10 mg by mouth 3 (three) times daily as needed.), Disp: 30 tablet, Rfl: 1   Empagliflozin-metFORMIN HCl 02-999 MG TABS, Take 1 tablet by mouth as directed. ,  Disp: , Rfl:    ezetimibe (ZETIA) 10 MG tablet, Take 10 mg by mouth daily., Disp: , Rfl:    irbesartan (AVAPRO) 75 MG tablet, Take 75 mg by mouth at bedtime., Disp: , Rfl:    lansoprazole (PREVACID) 30 MG capsule, Take 1 tablet by mouth daily., Disp: , Rfl:    levothyroxine (SYNTHROID) 175 MCG tablet, Take 175 mcg by mouth daily before breakfast. , Disp: , Rfl:    Multiple Vitamin (MULTIVITAMIN) capsule, Take 1 capsule by mouth daily., Disp: , Rfl:    nystatin (MYCOSTATIN/NYSTOP) powder, SMARTSIG:1 Application Topical 2-3 Times Daily, Disp: , Rfl:    OZEMPIC, 1 MG/DOSE, 4 MG/3ML SOPN, Inject 1 mg into the skin once a week., Disp: , Rfl:    UBRELVY 100 MG TABS, Take 100 mg by mouth as directed. , Disp: , Rfl:   Current Facility-Administered Medications:    triamcinolone acetonide (KENALOG) 10 MG/ML injection 10 mg, 10 mg, Other, Once, Bronson Ing, DPM    Physical Exam: Blood pressure 134/86, pulse 78, height '6\' 2"'$  (1.88 m), weight 258 lb (117 kg).   Affect appropriate Healthy:  appears stated age 51: normal Neck supple with no adenopathy JVP normal no bruits no thyromegaly Lungs clear with no wheezing and good diaphragmatic motion Heart:  S1/S2 no murmur, no rub, gallop or click PMI normal Abdomen: benighn, BS positve, no tenderness, no AAA no bruit.  No HSM or HJR Distal pulses intact with no bruits No edema Neuro non-focal Skin warm and dry No muscular weakness   Labs:   Lab Results  Component Value Date   WBC 10.4 (A) 08/30/2016   HGB 16.2 08/30/2016   HCT 45.1 08/30/2016   MCV 82.7 08/30/2016   No results for input(s): "NA", "K", "CL", "CO2", "BUN", "CREATININE", "CALCIUM", "PROT", "BILITOT", "ALKPHOS", "ALT", "AST", "GLUCOSE" in the last 168 hours.  Invalid input(s): "LABALBU" No results found for: "CKTOTAL", "CKMB", "CKMBINDEX", "TROPONINI"  Lab Results  Component Value Date   CHOL 261 (H) 10/15/2014   Lab Results  Component Value Date   HDL 58  10/15/2014   Lab Results  Component Value Date   LDLCALC 179 (H) 10/15/2014   Lab Results  Component Value Date   TRIG 121 10/15/2014   Lab Results  Component Value Date   CHOLHDL 4.5 10/15/2014   No results found for: "LDLDIRECT"    Radiology: No results found.  EKG: 08/04/2022 SR rate 78 LAD otherwise normal    ASSESSMENT AND PLAN:   1. CAD:  Subclinical noted on coronary calcium score. Normal myovue 09/07/20 achieved 9.8  METS continue statin and repatha per Dr Joylene Draft LdL goal 55 or less   2. HLD:  On statin and Zetia f/u Perrini try to get PSK9 approved He indicates LdL in 70 range but not 55 or less  3. DM:  Discussed low carb diet.  Target hemoglobin A1c is 6.5 or less.  Continue current medications.  4. Thyroid:  Continue synthroid replacement TSH with primary   5. Pulmonary Nodules stable by CT 02/08/22 largest in RML 9 mm rest Are <6 mm Radiology indicated stability for 20 months  and no need for f/u   F/U in a year   Signed: Jenkins Rouge 08/04/2022, 8:27 AM

## 2022-08-02 DIAGNOSIS — M549 Dorsalgia, unspecified: Secondary | ICD-10-CM | POA: Diagnosis not present

## 2022-08-04 ENCOUNTER — Encounter: Payer: Self-pay | Admitting: Cardiovascular Disease

## 2022-08-04 ENCOUNTER — Ambulatory Visit: Payer: Federal, State, Local not specified - PPO | Attending: Cardiovascular Disease | Admitting: Cardiovascular Disease

## 2022-08-04 VITALS — BP 134/86 | HR 78 | Ht 74.0 in | Wt 258.0 lb

## 2022-08-04 DIAGNOSIS — E782 Mixed hyperlipidemia: Secondary | ICD-10-CM | POA: Diagnosis not present

## 2022-08-04 DIAGNOSIS — R911 Solitary pulmonary nodule: Secondary | ICD-10-CM | POA: Diagnosis not present

## 2022-08-04 DIAGNOSIS — I2510 Atherosclerotic heart disease of native coronary artery without angina pectoris: Secondary | ICD-10-CM | POA: Diagnosis not present

## 2022-08-04 NOTE — Patient Instructions (Signed)
Medication Instructions:  Your physician recommends that you continue on your current medications as directed. Please refer to the Current Medication list given to you today.  *If you need a refill on your cardiac medications before your next appointment, please call your pharmacy*  Lab Work: If you have labs (blood work) drawn today and your tests are completely normal, you will receive your results only by: MyChart Message (if you have MyChart) OR A paper copy in the mail If you have any lab test that is abnormal or we need to change your treatment, we will call you to review the results.  Testing/Procedures: None ordered today.  Follow-Up: At [facility], you and your health needs are our priority.  As part of our continuing mission to provide you with exceptional heart care, we have created designated Provider Care Teams.  These Care Teams include your primary Cardiologist (physician) and Advanced Practice Providers (APPs -  Physician Assistants and Nurse Practitioners) who all work together to provide you with the care you need, when you need it.  We recommend signing up for the patient portal called "MyChart".  Sign up information is provided on this After Visit Summary.  MyChart is used to connect with patients for Virtual Visits (Telemedicine).  Patients are able to view lab/test results, encounter notes, upcoming appointments, etc.  Non-urgent messages can be sent to your provider as well.   To learn more about what you can do with MyChart, go to [email or web address].    Your next appointment:   1 year(s)  The format for your next appointment:   In Person  Provider:   Peter Nishan, MD     Important Information About Sugar       

## 2022-08-09 DIAGNOSIS — M549 Dorsalgia, unspecified: Secondary | ICD-10-CM | POA: Diagnosis not present

## 2022-08-16 DIAGNOSIS — M549 Dorsalgia, unspecified: Secondary | ICD-10-CM | POA: Diagnosis not present

## 2022-08-21 DIAGNOSIS — R7989 Other specified abnormal findings of blood chemistry: Secondary | ICD-10-CM | POA: Diagnosis not present

## 2022-08-21 DIAGNOSIS — E1169 Type 2 diabetes mellitus with other specified complication: Secondary | ICD-10-CM | POA: Diagnosis not present

## 2022-08-21 DIAGNOSIS — E785 Hyperlipidemia, unspecified: Secondary | ICD-10-CM | POA: Diagnosis not present

## 2022-08-21 DIAGNOSIS — Z125 Encounter for screening for malignant neoplasm of prostate: Secondary | ICD-10-CM | POA: Diagnosis not present

## 2022-08-21 DIAGNOSIS — E039 Hypothyroidism, unspecified: Secondary | ICD-10-CM | POA: Diagnosis not present

## 2022-08-28 DIAGNOSIS — Z1339 Encounter for screening examination for other mental health and behavioral disorders: Secondary | ICD-10-CM | POA: Diagnosis not present

## 2022-08-28 DIAGNOSIS — E785 Hyperlipidemia, unspecified: Secondary | ICD-10-CM | POA: Diagnosis not present

## 2022-08-28 DIAGNOSIS — R82998 Other abnormal findings in urine: Secondary | ICD-10-CM | POA: Diagnosis not present

## 2022-08-28 DIAGNOSIS — Z1331 Encounter for screening for depression: Secondary | ICD-10-CM | POA: Diagnosis not present

## 2022-08-28 DIAGNOSIS — Z0000 Encounter for general adult medical examination without abnormal findings: Secondary | ICD-10-CM | POA: Diagnosis not present

## 2022-08-30 DIAGNOSIS — M549 Dorsalgia, unspecified: Secondary | ICD-10-CM | POA: Diagnosis not present

## 2022-09-15 DIAGNOSIS — M549 Dorsalgia, unspecified: Secondary | ICD-10-CM | POA: Diagnosis not present

## 2022-10-04 DIAGNOSIS — M549 Dorsalgia, unspecified: Secondary | ICD-10-CM | POA: Diagnosis not present

## 2022-11-09 DIAGNOSIS — M549 Dorsalgia, unspecified: Secondary | ICD-10-CM | POA: Diagnosis not present

## 2022-11-30 DIAGNOSIS — M549 Dorsalgia, unspecified: Secondary | ICD-10-CM | POA: Diagnosis not present

## 2023-03-18 DIAGNOSIS — K5792 Diverticulitis of intestine, part unspecified, without perforation or abscess without bleeding: Secondary | ICD-10-CM | POA: Diagnosis not present

## 2023-05-07 DIAGNOSIS — E039 Hypothyroidism, unspecified: Secondary | ICD-10-CM | POA: Diagnosis not present

## 2023-05-07 DIAGNOSIS — E1169 Type 2 diabetes mellitus with other specified complication: Secondary | ICD-10-CM | POA: Diagnosis not present

## 2023-05-07 DIAGNOSIS — I10 Essential (primary) hypertension: Secondary | ICD-10-CM | POA: Diagnosis not present

## 2023-08-21 ENCOUNTER — Ambulatory Visit: Payer: Federal, State, Local not specified - PPO | Admitting: Cardiology

## 2023-10-23 IMAGING — MR MR HEAD WO/W CM
13 series · 48 of 48 positions shown · IV contrast (Multihance 20cc)
Comparison: No pertinent prior exam.

CLINICAL DATA: Optical migraines, double vision for 2 months

EXAM:
MRI HEAD WITHOUT AND WITH CONTRAST
MRA HEAD WITHOUT CONTRAST
TECHNIQUE: Multiplanar, multi-echo pulse sequences of the brain and surrounding
structures were acquired without and with intravenous contrast.
Angiographic images of the Circle of Willis were acquired using MRA
technique without intravenous contrast.
CONTRAST:  20mL MULTIHANCE GADOBENATE DIMEGLUMINE 529 MG/ML IV SOLN

[Series 5: T1 · sagittal · 4.0mm · 0.75mm/px · 1 of 31 slices shown (1 of 3)]
[im 1/31]
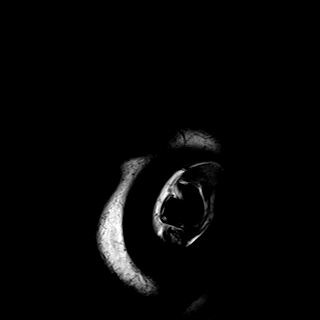

[Series 9: DWI · axial · 3.0mm · 0.94mm/px · z∈[-57,+94]mm · 9 of 172 slices shown (1 of 3)]
[im 1/172]
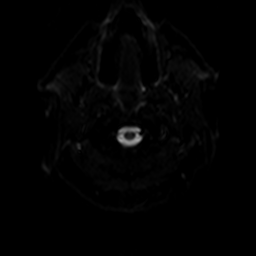
[im 22/172]
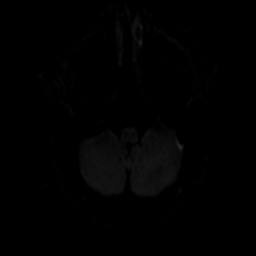
[im 43/172]
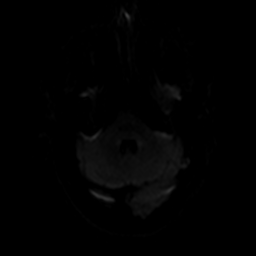
[im 65/172]
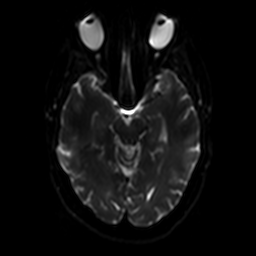
[im 86/172]
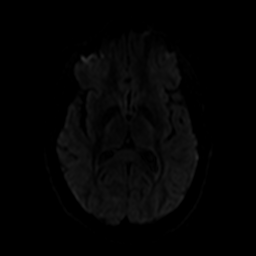
[im 107/172]
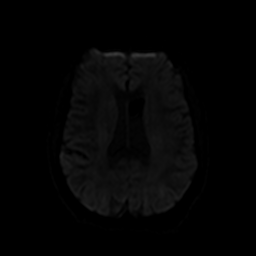
[im 129/172]
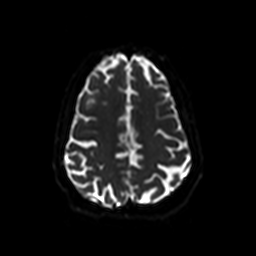
[im 150/172]
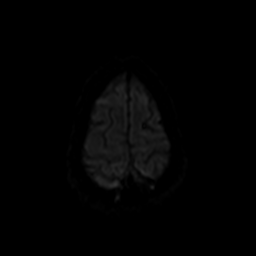
[im 172/172]
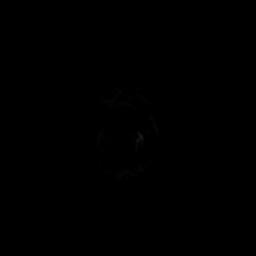

[Series 10: ax dwi_tracew · axial · 3.0mm · 0.94mm/px · z∈[-57,+94]mm · 4 of 86 slices shown]
[im 1/86]
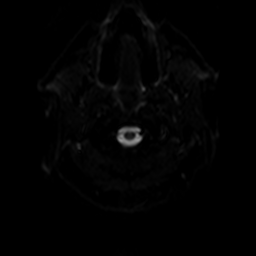
[im 29/86]
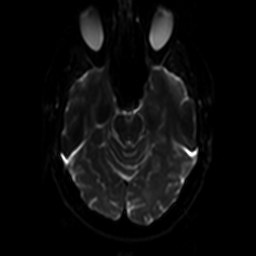
[im 57/86]
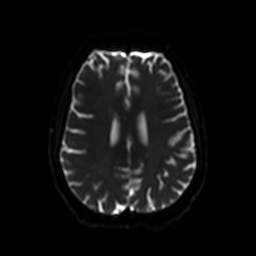
[im 86/86]
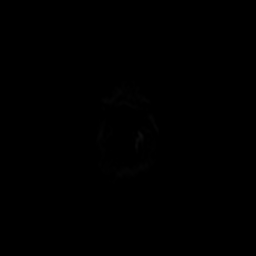

[Series 11: ax dwi_adc · axial · 3.0mm · 0.94mm/px · z∈[-57,+94]mm · 2 of 42 slices shown]
[im 1/42]
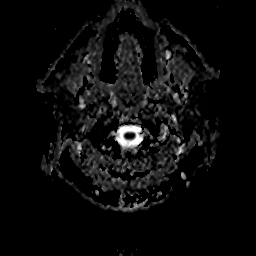
[im 42/42]
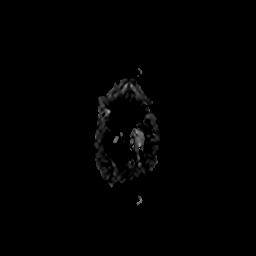

[Series 12: DWI · coronal · 5.0mm · 1.44mm/px · 3 of 66 slices shown (2 of 3)]
[im 1/66]
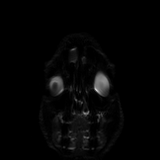
[im 33/66]
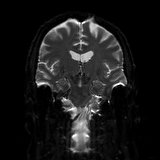
[im 66/66]
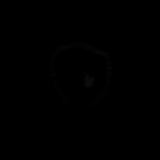

[Series 13: DWI · coronal · 5.0mm · 1.44mm/px · 2 of 33 slices shown (3 of 3)]
[im 1/33]
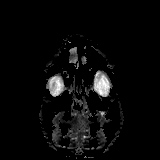
[im 33/33]
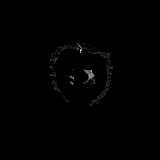

[Series 14: T2 · axial · 4.0mm · 0.36mm/px · 1 of 29 slices shown]
[im 1/29]
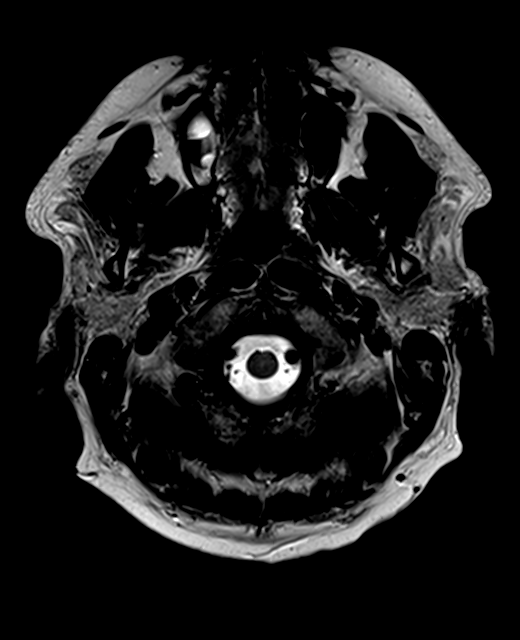

[Series 15: FLAIR · axial · 3.0mm · 0.72mm/px · 1 of 26 slices shown]
[im 1/26]
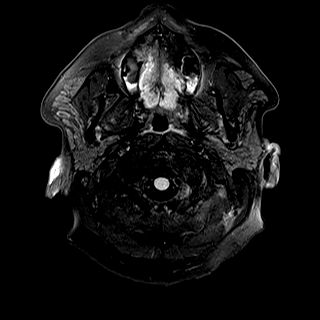

[Series 16: swi_images · axial · 1.5mm · 0.90mm/px · z∈[-48,+94]mm · 5 of 96 slices shown]
[im 1/96]
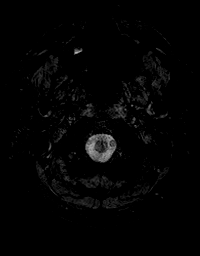
[im 24/96]
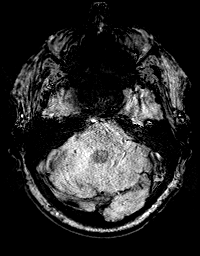
[im 48/96]
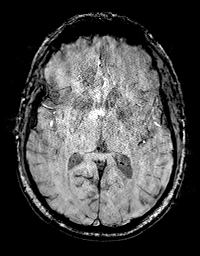
[im 72/96]
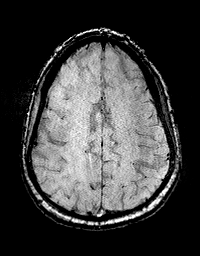
[im 96/96]
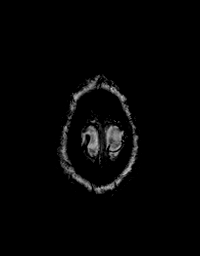

[Series 18: T1 · axial · 1.0mm · 0.94mm/px · z∈[-55,+103]mm · 8 of 160 slices shown (2 of 3)]
[im 1/160]
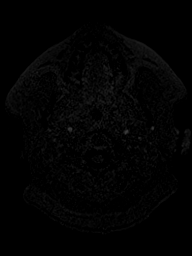
[im 23/160]
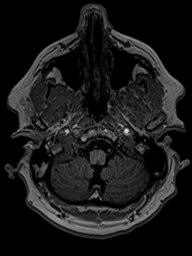
[im 46/160]
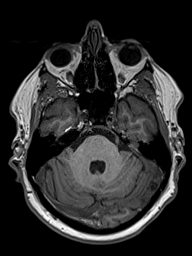
[im 69/160]
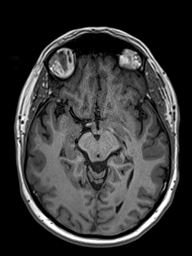
[im 91/160]
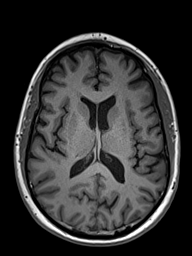
[im 114/160]
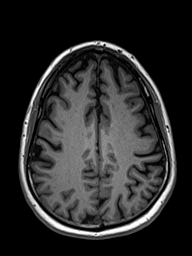
[im 137/160]
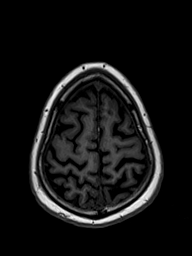
[im 160/160]
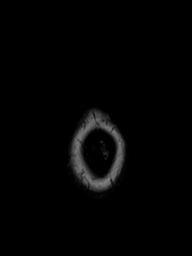

[Series 19: T2 post-contrast · coronal · 4.5mm · 0.36mm/px · 2 of 35 slices shown]
[im 1/35]
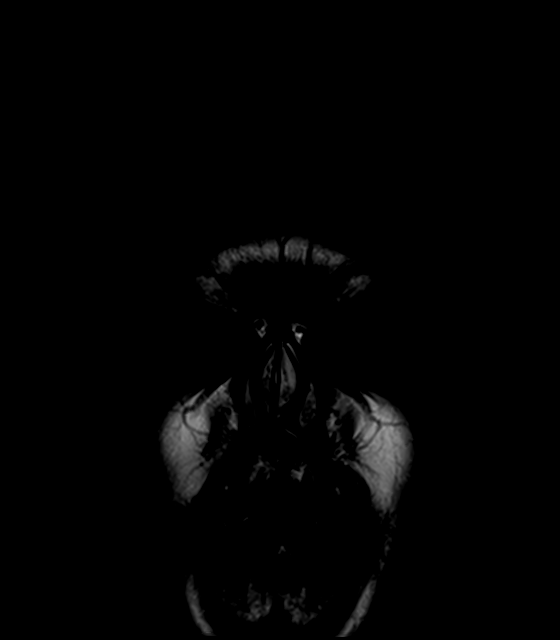
[im 35/35]
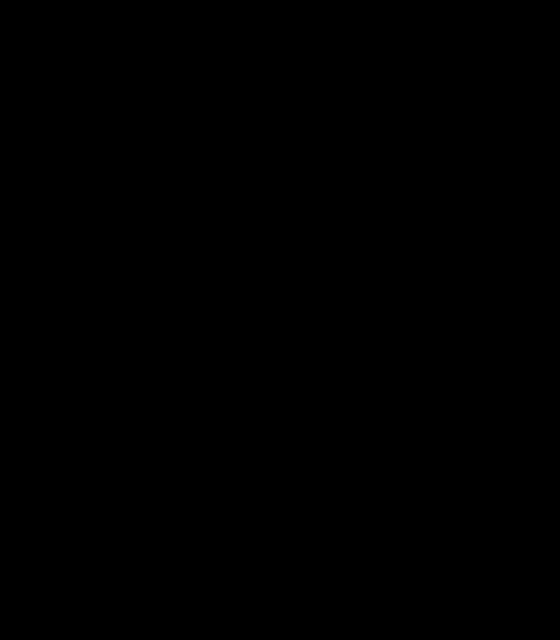

[Series 20: T1 · axial · 1.0mm · 0.94mm/px · z∈[-55,+103]mm · 8 of 160 slices shown (3 of 3)]
[im 1/160]
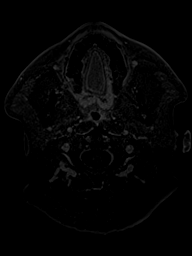
[im 23/160]
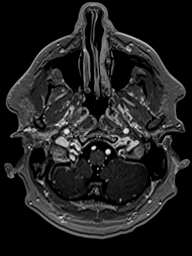
[im 46/160]
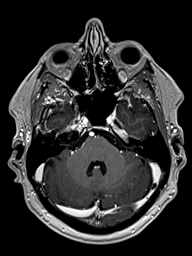
[im 69/160]
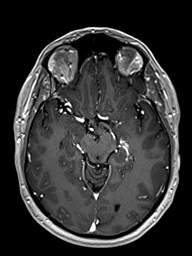
[im 91/160]
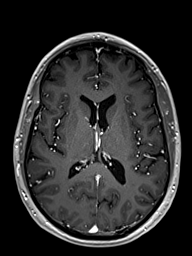
[im 114/160]
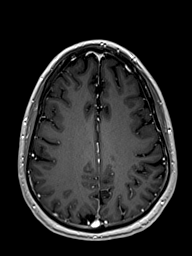
[im 137/160]
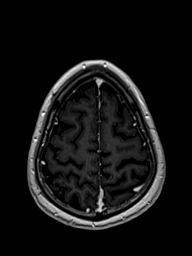
[im 160/160]
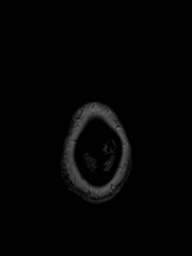

[Series 21: T1 post-contrast · coronal · 4.5mm · 0.72mm/px · 2 of 35 slices shown]
[im 1/35]
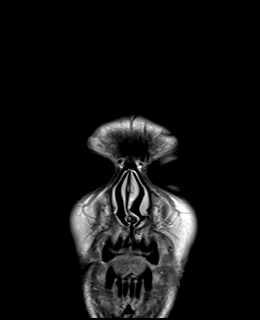
[im 35/35]
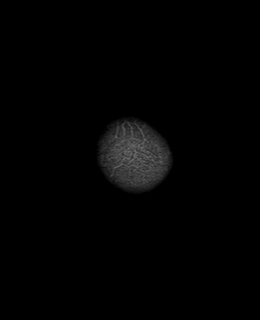

[48 of 48 positions shown; findings below may reference images not displayed]

FINDINGS: MRI HEAD FINDINGS

Brain: There is a small focus of elevated DWI signal in the right
centrum semiovale without corresponding low ADC signal. This
findings consistent with T2 shine through related to a focus of
nonspecific FLAIR signal abnormality in the right frontal lobe
subcortical white matter. There is no other abnormal diffusion
restriction to suggest acute infarct. There is no evidence of acute
intracranial hemorrhage or extra-axial fluid collection.

There is a single additional nonspecific focus of FLAIR signal
abnormality in the right frontal lobe subcortical white matter.
There is no suspicious parenchymal signal abnormality.

Parenchymal volume is within normal limits. The ventricles are
normal in size.

There is no solid mass lesion. There is no abnormal enhancement.
Specifically, there is no abnormal enhancement of the third cranial
nerves. There is no midline shift.

Vascular: Normal flow voids.

Skull and upper cervical spine: Normal marrow signal.

Sinuses/Orbits: There is mild mucosal thickening along the floors of
the maxillary sinuses. The globes and orbits are unremarkable.

Other: None.

MRA HEAD FINDINGS

Anterior circulation: The intracranial ICAs are patent.

The bilateral MCAs are patent.  The bilateral ACAs are patent.

There is no aneurysm.

Posterior circulation: The bilateral V4 segments are patent. The
basilar artery is patent.

The bilateral PCAs are patent. Diminutive posterior communicating
arteries are identified.

There is no aneurysm.

Anatomic variants: As above.
IMPRESSION: 1. No acute intracranial pathology. No abnormal enhancement of the
third cranial nerves.
2. A few small foci of FLAIR signal abnormality in the right frontal
lobe are nonspecific and are most commonly seen with chronic white
matter microangiopathy, but could also be seen the setting of
migraines.
3. Normal MRA of the head.  No aneurysm identified.

## 2023-10-23 IMAGING — MR MR MRA HEAD W/O CM
1 series · 10 of 48 positions shown · IV contrast (multihance)
Comparison: No pertinent prior exam.

CLINICAL DATA: Optical migraines, double vision for 2 months

EXAM:
MRI HEAD WITHOUT AND WITH CONTRAST
MRA HEAD WITHOUT CONTRAST
TECHNIQUE: Multiplanar, multi-echo pulse sequences of the brain and surrounding
structures were acquired without and with intravenous contrast.
Angiographic images of the Circle of Willis were acquired using MRA
technique without intravenous contrast.
CONTRAST:  20mL MULTIHANCE GADOBENATE DIMEGLUMINE 529 MG/ML IV SOLN

[Series 7: [id number]_multi-[id number] · axial · 0.6mm · 0.26mm/px · z∈[-41,+28]mm · 10 of 149 slices shown]
[im 10/149]
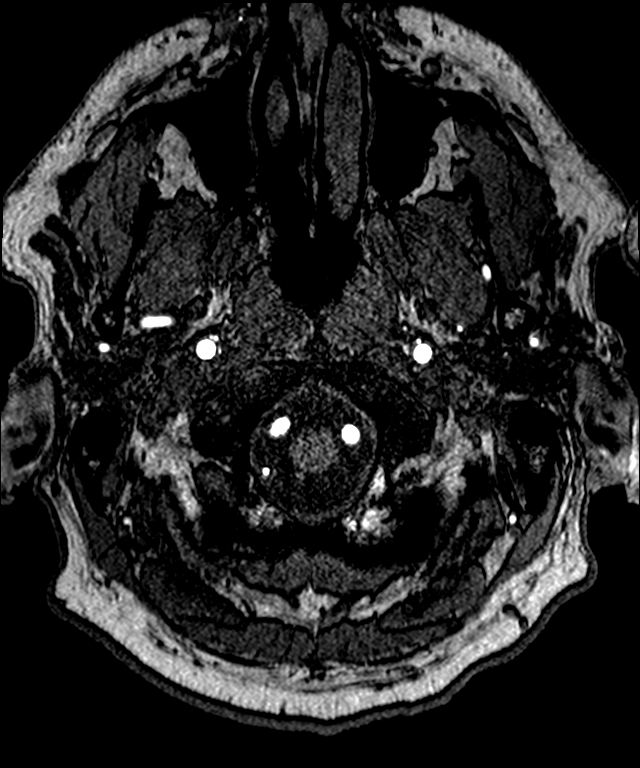
[im 26/149]
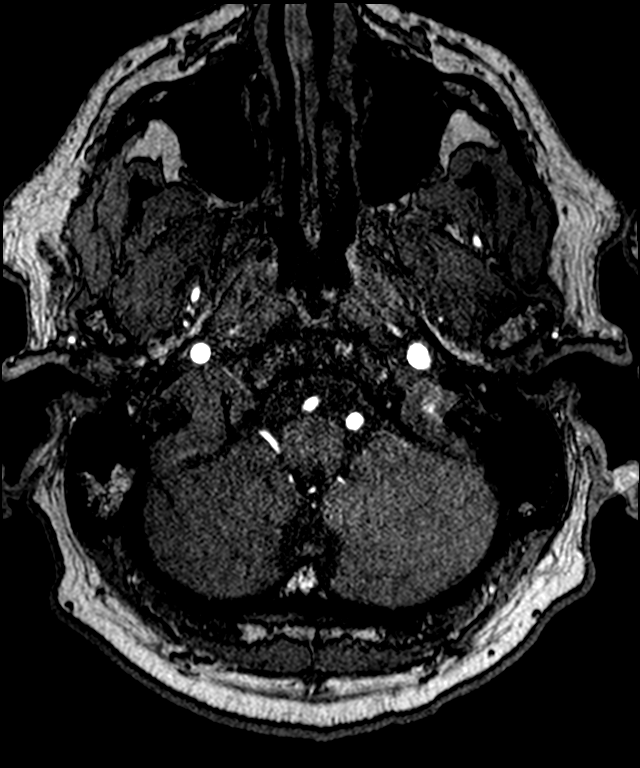
[im 29/149]
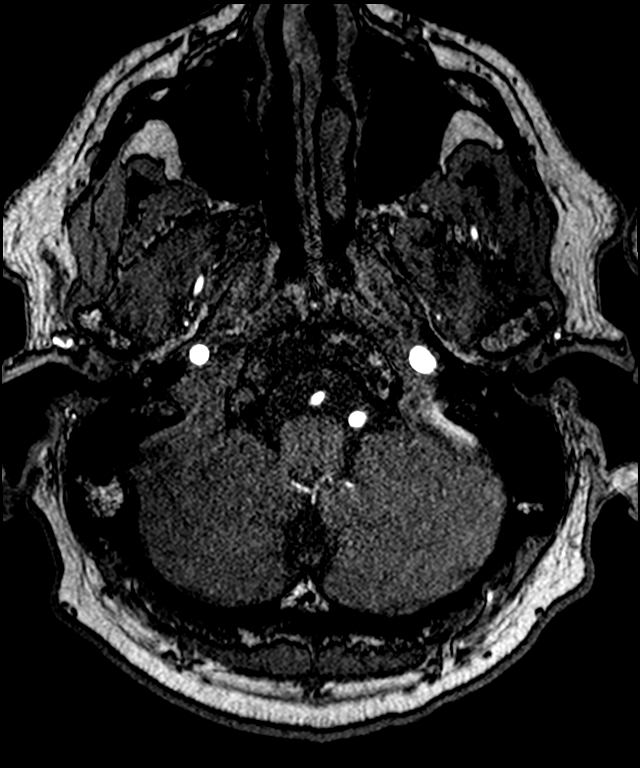
[im 48/149]
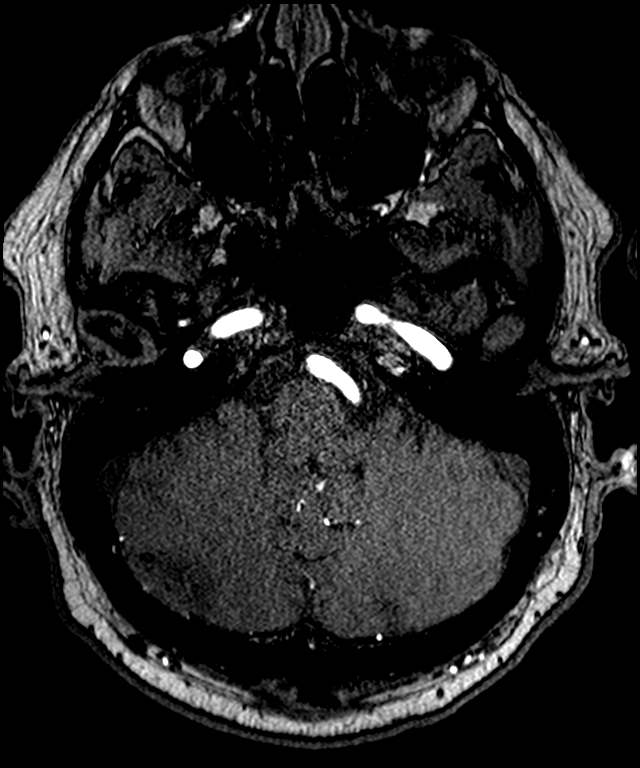
[im 67/149]
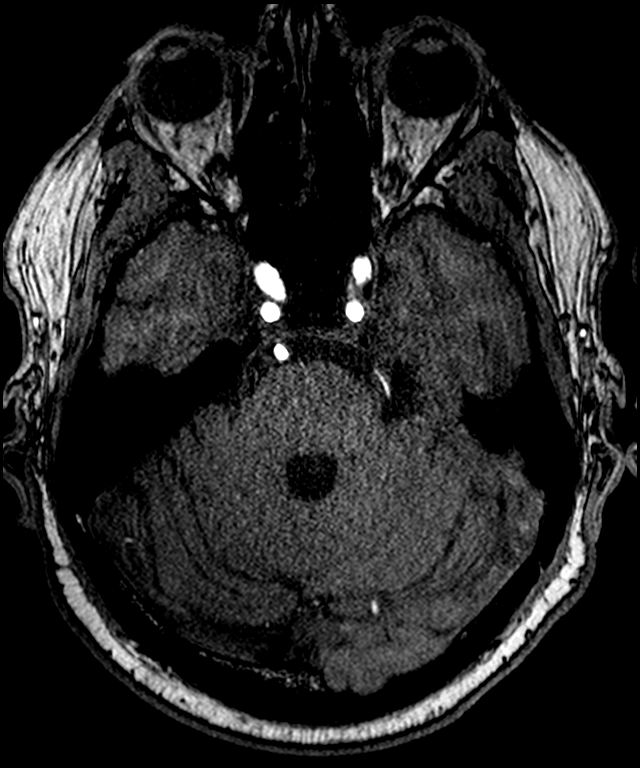
[im 76/149]
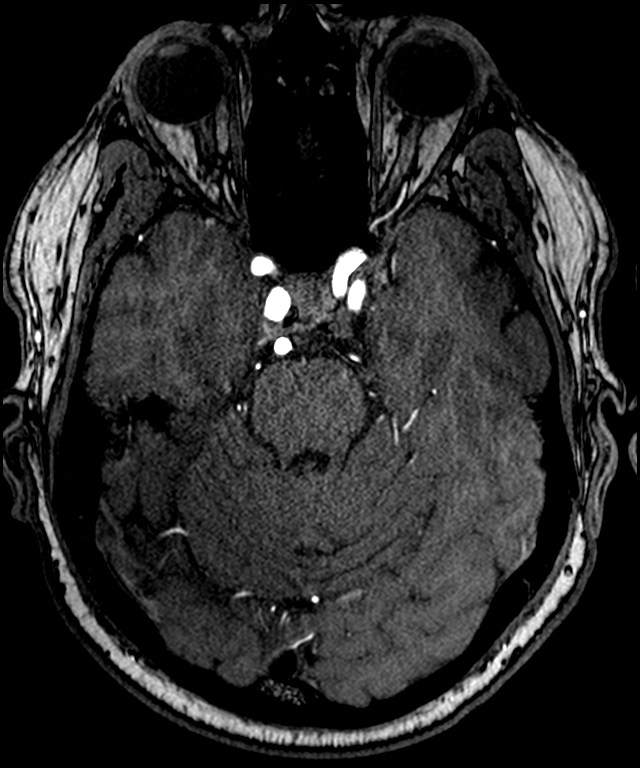
[im 86/149]
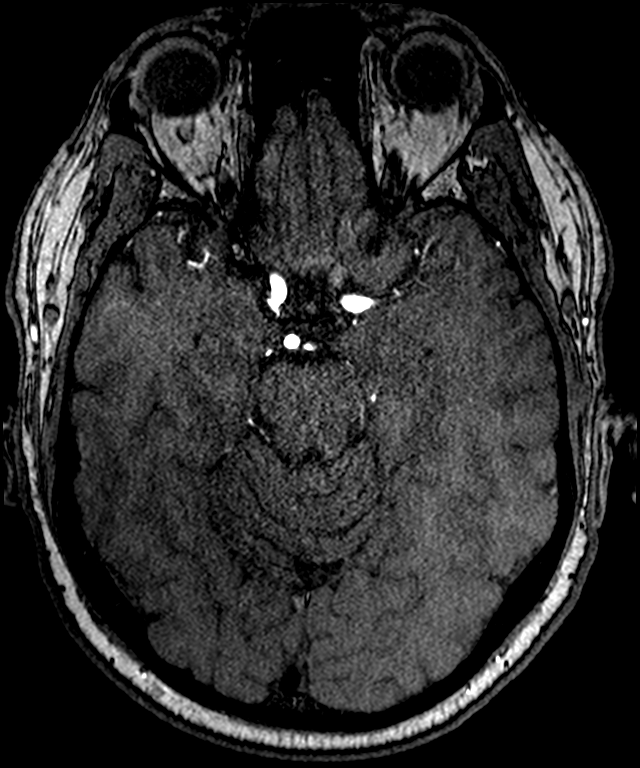
[im 104/149]
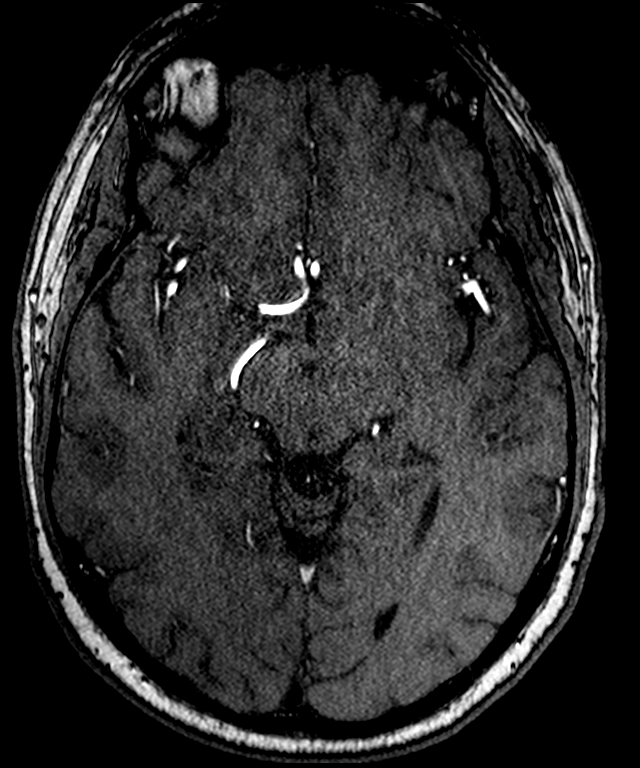
[im 123/149]
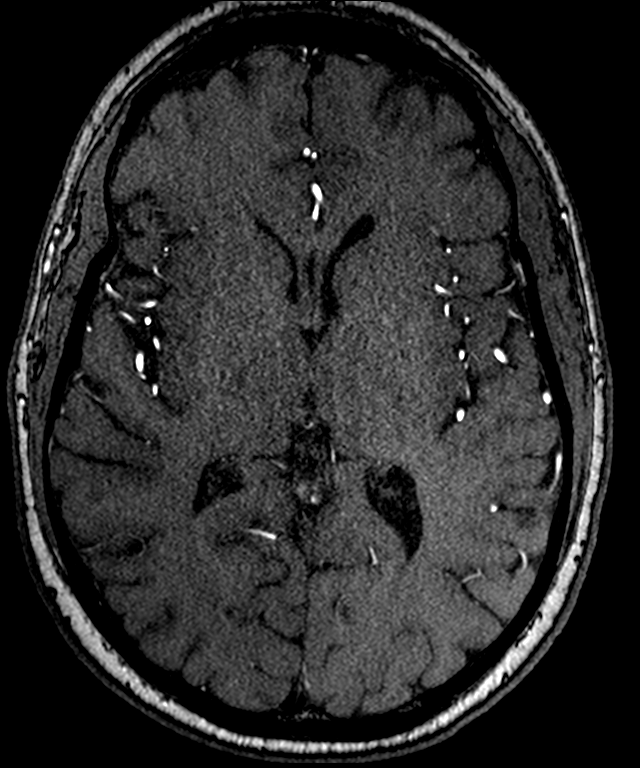
[im 126/149]
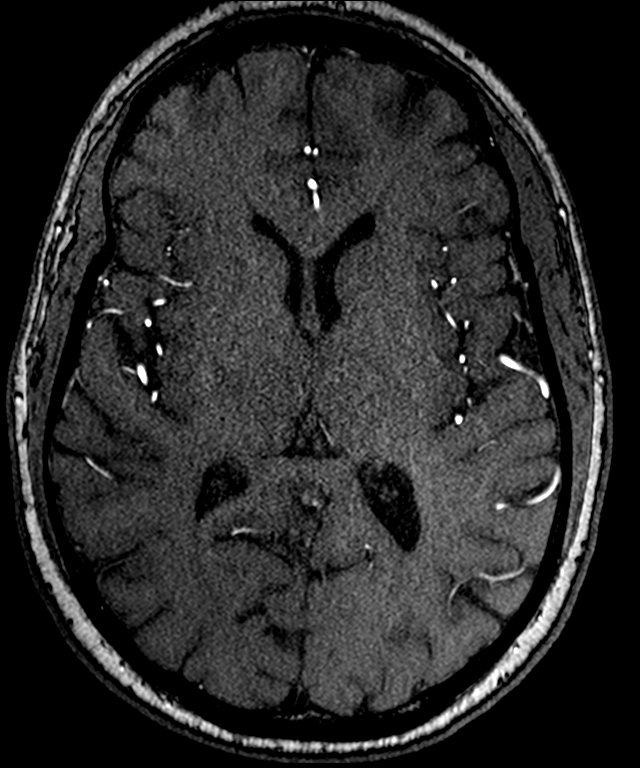

[10 of 48 positions shown; findings below may reference images not displayed]

FINDINGS: MRI HEAD FINDINGS

Brain: There is a small focus of elevated DWI signal in the right
centrum semiovale without corresponding low ADC signal. This
findings consistent with T2 shine through related to a focus of
nonspecific FLAIR signal abnormality in the right frontal lobe
subcortical white matter. There is no other abnormal diffusion
restriction to suggest acute infarct. There is no evidence of acute
intracranial hemorrhage or extra-axial fluid collection.

There is a single additional nonspecific focus of FLAIR signal
abnormality in the right frontal lobe subcortical white matter.
There is no suspicious parenchymal signal abnormality.

Parenchymal volume is within normal limits. The ventricles are
normal in size.

There is no solid mass lesion. There is no abnormal enhancement.
Specifically, there is no abnormal enhancement of the third cranial
nerves. There is no midline shift.

Vascular: Normal flow voids.

Skull and upper cervical spine: Normal marrow signal.

Sinuses/Orbits: There is mild mucosal thickening along the floors of
the maxillary sinuses. The globes and orbits are unremarkable.

Other: None.

MRA HEAD FINDINGS

Anterior circulation: The intracranial ICAs are patent.

The bilateral MCAs are patent.  The bilateral ACAs are patent.

There is no aneurysm.

Posterior circulation: The bilateral V4 segments are patent. The
basilar artery is patent.

The bilateral PCAs are patent. Diminutive posterior communicating
arteries are identified.

There is no aneurysm.

Anatomic variants: As above.
IMPRESSION: 1. No acute intracranial pathology. No abnormal enhancement of the
third cranial nerves.
2. A few small foci of FLAIR signal abnormality in the right frontal
lobe are nonspecific and are most commonly seen with chronic white
matter microangiopathy, but could also be seen the setting of
migraines.
3. Normal MRA of the head.  No aneurysm identified.

## 2023-10-23 NOTE — Progress Notes (Signed)
Cardiology Office Note    Patient Name: Nathan Finley Date of Encounter: 10/23/2023  Primary Care Provider:  Rodrigo Ran, MD Primary Cardiologist:  Charlton Haws, MD Primary Electrophysiologist: None   Past Medical History    Past Medical History:  Diagnosis Date   Anal fissure    Anxiety    Benign neoplasm    Diabetes mellitus without complication (HCC)    Golfer's elbow    Hemorrhoids    Hyperlipidemia    Hypothyroidism    Migraine with aura    Morbid obesity (HCC)    Obesity    OSA (obstructive sleep apnea)    Tennis elbow    Testicular hypofunction    Thyroid disease    hypothyroidism   Toe pain     History of Present Illness  Nathan Finley is a 63 y.o. male with a PMH of coronary calcifications, HLD, DM type II, aortic atherosclerosis, hypothyroidism and, anxiety, obesity s/p gastric sleeve who presents today for 1 year follow-up.  [name] was seen initially by Dr. Eden Emms in 2021 after undergoing calcium score that was elevated at 474 which was 90th percentile.  He completed a Myoview that showed estimated EF of 40% with no ischemia.  He then completed a TTE that showed preserved EF of 55 to 60% with no valve abnormalities and mild aortic dilation measuring 3.7 cm.  He underwent CT of the chest that showed multiple stable nodules in the right middle lobe with nonaneurysmal aorta.  He was last seen by Dr. Eden Emms on 08/04/2022 and reported no chest pain.  He completed a CT of the chest on 02/2022 that showed stable right middle lobe nodules and no acute abnormalities with aortic atherosclerosis.  Mr. Levandowski presents today for overdue follow-up along.   He reports experiencing occasional dizziness upon standing up too quickly. He attributes this to possible dehydration. He also experiences chest discomfort, described as a cracking sensation, similar to cracking knuckles. He reports occasional tingling in his left hand, which he believes may be related to a previous  ulnar injury. The patient is active, exercising three days a week, but also spends a significant amount of time sitting due to his job. He has been on Ozempic for weight loss and has lost approximately 18 pounds since his last visit. He is also on Repatha for cholesterol management.during today's visit his blood pressure was stable at 126/84 and EKG showed new left posterior fascicular block along with T WI in lead III.  Patient denies chest pain, palpitations, dyspnea, PND, orthopnea, nausea, vomiting, dizziness, syncope, edema, weight gain, or early satiety.   Review of Systems  Please see the history of present illness.    All other systems reviewed and are otherwise negative except as noted above.  Physical Exam    Wt Readings from Last 3 Encounters:  08/04/22 258 lb (117 kg)  02/25/21 266 lb (120.7 kg)  09/07/20 276 lb (125.2 kg)   WU:[name] were no vitals filed for this visit.,There is no height or weight on file to calculate BMI. GEN: Well nourished, well developed in no acute distress Neck: No JVD; No carotid bruits Pulmonary: Clear to auscultation without rales, wheezing or rhonchi  Cardiovascular: Normal rate. Regular rhythm. Normal S1. Normal S2.   Murmurs: There is no murmur.  ABDOMEN: Soft, non-tender, non-distended EXTREMITIES:  No edema; No deformity   EKG/LABS/ Recent Cardiac Studies   ECG personally reviewed by me today -sinus rhythm with left posterior fascicular block with right axis deviation  with TWI in lead III and rate of 65 bpm with no acute changes consistent with previous EKG  Risk Assessment/Calculations:      Lab Results  Component Value Date   WBC 10.4 (A) 08/30/2016   HGB 16.2 08/30/2016   HCT 45.1 08/30/2016   MCV 82.7 08/30/2016   Lab Results  Component Value Date   CREATININE 0.94 10/15/2014   BUN 17 10/15/2014   NA 137 10/15/2014   K 4.6 10/15/2014   CL 100 10/15/2014   CO2 30 10/15/2014   Lab Results  Component Value Date   CHOL 261 (H)  10/15/2014   HDL 58 10/15/2014   LDLCALC 179 (H) 10/15/2014   TRIG 121 10/15/2014   CHOLHDL 4.5 10/15/2014    No results found for: "HGBA1C" Assessment & Plan    1.  Coronary artery disease: -Patient completed Myoview in 2021 that showed no evidence of ischemia -Today patient reports chest discomfort but does not endorse pain -EKG was completed showing new findings of left posterior fascicular block and TWI in lead III -Due to patient's risk factors we will have him complete a 2D echo and complete a coronary CTA for visualization of coronary arteries to assess for blockage. -BMET today  2.  Pulmonary nodules -CT of the chest completed showing aortic atherosclerosis with stable right lobe pulmonary nodules -Continue follow-up with pulmonology  3.  Hyperlipidemia: -We will reach out to patient's PCP to obtain most recent lab results -Continue Repatha 140 mg ezetimibe 10 mg, and Lipitor 40 mg daily  4.  DM type II: -Most recent hemoglobin A1c was 6.2 -Continue Ozempic and empagliflozin-metformin 02/999 mg  5. Orthostatic Hypotension: Occasional dizziness upon standing. Possible medication side effect (Irbesartan) or dehydration. -Encouraged to stay hydrated and rise slowly from a seated or lying position.  Disposition: Follow-up with Charlton Haws, MD or APP in 3 months   Signed, Napoleon Form, Leodis Rains, NP 10/23/2023, 8:22 AM [facility]

## 2023-10-25 ENCOUNTER — Ambulatory Visit: Payer: Federal, State, Local not specified - PPO | Attending: Nurse Practitioner | Admitting: Nurse Practitioner

## 2023-10-25 ENCOUNTER — Encounter: Payer: Self-pay | Admitting: Nurse Practitioner

## 2023-10-25 VITALS — BP 126/84 | HR 86 | Ht 74.0 in | Wt 248.8 lb

## 2023-10-25 DIAGNOSIS — I444 Left anterior fascicular block: Secondary | ICD-10-CM | POA: Diagnosis not present

## 2023-10-25 DIAGNOSIS — I2510 Atherosclerotic heart disease of native coronary artery without angina pectoris: Secondary | ICD-10-CM | POA: Diagnosis not present

## 2023-10-25 DIAGNOSIS — E782 Mixed hyperlipidemia: Secondary | ICD-10-CM | POA: Diagnosis not present

## 2023-10-25 DIAGNOSIS — I700 Atherosclerosis of aorta: Secondary | ICD-10-CM

## 2023-10-25 DIAGNOSIS — R911 Solitary pulmonary nodule: Secondary | ICD-10-CM

## 2023-10-25 LAB — BASIC METABOLIC PANEL
BUN/Creatinine Ratio: 12 (ref 10–24)
BUN: 13 mg/dL (ref 8–27)
CO2: 23 mmol/L (ref 20–29)
Calcium: 10.2 mg/dL (ref 8.6–10.2)
Chloride: 104 mmol/L (ref 96–106)
Creatinine, Ser: 1.05 mg/dL (ref 0.76–1.27)
Glucose: 142 mg/dL — ABNORMAL HIGH (ref 70–99)
Potassium: 5.2 mmol/L (ref 3.5–5.2)
Sodium: 144 mmol/L (ref 134–144)
eGFR: 79 mL/min/{1.73_m2} (ref >59)

## 2023-10-25 MED ORDER — DIPHENHYDRAMINE HCL 50 MG PO TABS: ORAL_TABLET | ORAL | 0 refills | Status: DC

## 2023-10-25 MED ORDER — METOPROLOL TARTRATE 50 MG PO TABS: ORAL_TABLET | ORAL | 0 refills | Status: DC

## 2023-10-25 MED ORDER — PREDNISONE 50 MG PO TABS: ORAL_TABLET | ORAL | 0 refills | Status: DC

## 2023-10-25 NOTE — Patient Instructions (Addendum)
Medication Instructions:  Your physician recommends that you continue on your current medications as directed. Please refer to the Current Medication list given to you today.  *If you need a refill on your cardiac medications before your next appointment, please call your pharmacy*   Lab Work: BMET - Today   If you have labs (blood work) drawn today and your tests are completely normal, you will receive your results only by: MyChart Message (if you have MyChart) OR A paper copy in the mail If you have any lab test that is abnormal or we need to change your treatment, we will call you to review the results.   Testing/Procedures: Your physician has ordered for you to have a coronary CTA. Please refer to instructions given   Your physician has requested that you have an echocardiogram. Echocardiography is a painless test that uses sound waves to create images of your heart. It provides your doctor with information about the size and shape of your heart and how well your heart's chambers and valves are working. This procedure takes approximately one hour. There are no restrictions for this procedure. Please do NOT wear cologne, perfume, aftershave, or lotions (deodorant is allowed). Please arrive 15 minutes prior to your appointment time.  Please note: We ask at that you not bring children with you during ultrasound (echo/ vascular) testing. Due to room size and safety concerns, children are not allowed in the ultrasound rooms during exams. Our front office staff cannot provide observation of children in our lobby area while testing is being conducted. An adult accompanying a patient to their appointment will only be allowed in the ultrasound room at the discretion of the ultrasound technician under special circumstances. We apologize for any inconvenience.   Follow-Up: At [facility], you and your health needs are our priority.  As part of our continuing mission to provide you with  exceptional heart care, we have created designated Provider Care Teams.  These Care Teams include your primary Cardiologist (physician) and Advanced Practice Providers (APPs -  Physician Assistants and Nurse Practitioners) who all work together to provide you with the care you need, when you need it.  We recommend signing up for the patient portal called "MyChart".  Sign up information is provided on this After Visit Summary.  MyChart is used to connect with patients for Virtual Visits (Telemedicine).  Patients are able to view lab/test results, encounter notes, upcoming appointments, etc.  Non-urgent messages can be sent to your provider as well.   To learn more about what you can do with MyChart, go to [email or web address].    Your next appointment:   3 month(s)  Provider:   Charlton Haws, MD   or APP  Other Instructions   Your cardiac CT will be scheduled at one of the below locations:   [facility] [address] [place], [place] [address] [phone]   If scheduled at [facility], please arrive at the [facility] and Children's Entrance (Entrance C2) of [facility] 30 minutes prior to test start time. You can use the FREE valet parking offered at entrance C (encouraged to control the heart rate for the test)  Proceed to the [facility] Radiology Department (first floor) to check-in and test prep.  All radiology patients and guests should use entrance C2 at [facility], accessed from [facility], even though the hospital's physical address listed is [address].      Please follow these  instructions carefully (unless otherwise directed):  An IV will be required for this test and Nitroglycerin will be given.  Hold all erectile dysfunction medications at least 3 days (72 hrs) prior to test. (Ie viagra, cialis, sildenafil, tadalafil, etc)   On the Night Before the Test: Be sure to Drink plenty of water. Do not consume  any caffeinated/decaffeinated beverages or chocolate 12 hours prior to your test. Do not take any antihistamines 12 hours prior to your test. If the patient has contrast allergy: Patient will need a prescription for Prednisone and very clear instructions (as follows): Prednisone 50 mg - take 13 hours prior to test Take another Prednisone 50 mg 7 hours prior to test Take another Prednisone 50 mg 1 hour prior to test Take Benadryl 50 mg 1 hour prior to test Patient must complete all four doses of above prophylactic medications. Patient will need a ride after test due to Benadryl.  On the Day of the Test: Drink plenty of water until 1 hour prior to the test. Do not eat any food 1 hour prior to test. You may take your regular medications prior to the test.  Take metoprolol (Lopressor) two hours prior to test. Patients who wear a continuous glucose monitor MUST remove the device prior to scanning.       After the Test: Drink plenty of water. After receiving IV contrast, you may experience a mild flushed feeling. This is normal. On occasion, you may experience a mild rash up to 24 hours after the test. This is not dangerous. If this occurs, you can take Benadryl 25 mg and increase your fluid intake. If you experience trouble breathing, this can be serious. If it is severe call 911 IMMEDIATELY. If it is mild, please call our office.  We will call to schedule your test 2-4 weeks out understanding that some insurance companies will need an authorization prior to the service being performed.   For more information and frequently asked questions, please visit our website : [email or web address]  For non-scheduling related questions, please contact the cardiac imaging nurse navigator should you have any questions/concerns: Cardiac Imaging Nurse Navigators Direct Office Dial: [phone]   For scheduling needs, including cancellations and rescheduling, please call [place],  [phone].

## 2023-10-26 ENCOUNTER — Other Ambulatory Visit: Payer: Self-pay | Admitting: Nurse Practitioner

## 2023-11-06 ENCOUNTER — Telehealth (HOSPITAL_COMMUNITY): Payer: Self-pay | Admitting: *Deleted

## 2023-11-06 NOTE — Telephone Encounter (Signed)
Reaching out to patient to offer assistance regarding upcoming cardiac imaging study; pt verbalizes understanding of appt date/time, parking situation and where to check in, pre-test NPO status and medications ordered, and verified current allergies; name and call back number provided for further questions should they arise Johney Frame RN Navigator Cardiac Imaging [facility] [phone] office [phone] cell  Discussed allergy prep-patient verbalized understanding.

## 2023-11-06 NOTE — Telephone Encounter (Signed)
Attempted to call patient regarding upcoming cardiac CT appointment. Left message on voicemail with name and callback number Johney Frame RN Navigator Cardiac Imaging [facility] [phone] Office

## 2023-11-07 ENCOUNTER — Ambulatory Visit (HOSPITAL_COMMUNITY)
Admission: RE | Admit: 2023-11-07 | Discharge: 2023-11-07 | Disposition: A | Discharge status: Home / Self Care | Payer: Federal, State, Local not specified - PPO | Source: Ambulatory Visit | Attending: Nurse Practitioner | Admitting: Nurse Practitioner

## 2023-11-07 DIAGNOSIS — I444 Left anterior fascicular block: Secondary | ICD-10-CM | POA: Diagnosis not present

## 2023-11-07 DIAGNOSIS — R931 Abnormal findings on diagnostic imaging of heart and coronary circulation: Secondary | ICD-10-CM | POA: Insufficient documentation

## 2023-11-07 DIAGNOSIS — I2510 Atherosclerotic heart disease of native coronary artery without angina pectoris: Secondary | ICD-10-CM

## 2023-11-07 MED ORDER — NITROGLYCERIN 0.4 MG SL SUBL
0.8000 mg | SUBLINGUAL_TABLET | Freq: Once | SUBLINGUAL | Status: AC
  Administered 2023-11-07: 0.8 mg via SUBLINGUAL

## 2023-11-07 MED ORDER — NITROGLYCERIN 0.4 MG SL SUBL
SUBLINGUAL_TABLET | SUBLINGUAL | Status: AC
  Filled 2023-11-07: qty 2

## 2023-11-07 MED ORDER — IOHEXOL 350 MG/ML SOLN
95.0000 mL | Freq: Once | INTRAVENOUS | Status: AC | PRN
  Administered 2023-11-07: 95 mL via INTRAVENOUS

## 2023-11-07 NOTE — Progress Notes (Signed)
Patient tolerated CT well. Vital signs stable encourage to drink water throughout day.Reasons explained and verbalized understanding. Ambulated steady gait.

## 2023-11-08 ENCOUNTER — Other Ambulatory Visit: Payer: Self-pay | Admitting: Cardiovascular Disease

## 2023-11-08 ENCOUNTER — Ambulatory Visit (HOSPITAL_BASED_OUTPATIENT_CLINIC_OR_DEPARTMENT_OTHER)
Admission: RE | Admit: 2023-11-08 | Discharge: 2023-11-08 | Disposition: A | Discharge status: Home / Self Care | Payer: Federal, State, Local not specified - PPO | Source: Ambulatory Visit | Attending: Cardiovascular Disease | Admitting: Cardiovascular Disease

## 2023-11-08 DIAGNOSIS — R931 Abnormal findings on diagnostic imaging of heart and coronary circulation: Secondary | ICD-10-CM | POA: Diagnosis not present

## 2023-11-08 DIAGNOSIS — I444 Left anterior fascicular block: Secondary | ICD-10-CM | POA: Diagnosis not present

## 2023-11-09 DIAGNOSIS — E039 Hypothyroidism, unspecified: Secondary | ICD-10-CM | POA: Diagnosis not present

## 2023-11-09 DIAGNOSIS — Z1212 Encounter for screening for malignant neoplasm of rectum: Secondary | ICD-10-CM | POA: Diagnosis not present

## 2023-11-09 DIAGNOSIS — E785 Hyperlipidemia, unspecified: Secondary | ICD-10-CM | POA: Diagnosis not present

## 2023-11-09 DIAGNOSIS — E291 Testicular hypofunction: Secondary | ICD-10-CM | POA: Diagnosis not present

## 2023-11-09 DIAGNOSIS — E1169 Type 2 diabetes mellitus with other specified complication: Secondary | ICD-10-CM | POA: Diagnosis not present

## 2023-11-09 DIAGNOSIS — Z125 Encounter for screening for malignant neoplasm of prostate: Secondary | ICD-10-CM | POA: Diagnosis not present

## 2023-11-15 ENCOUNTER — Ambulatory Visit (HOSPITAL_COMMUNITY): Payer: Federal, State, Local not specified - PPO | Attending: Nurse Practitioner

## 2023-11-15 DIAGNOSIS — I444 Left anterior fascicular block: Secondary | ICD-10-CM | POA: Insufficient documentation

## 2023-11-15 DIAGNOSIS — I517 Cardiomegaly: Secondary | ICD-10-CM

## 2023-11-15 DIAGNOSIS — I77810 Thoracic aortic ectasia: Secondary | ICD-10-CM

## 2023-11-15 LAB — ECHOCARDIOGRAM COMPLETE
Area-P 1/2: 2.81 cm2
S' Lateral: 2.9 cm

## 2023-11-16 DIAGNOSIS — I10 Essential (primary) hypertension: Secondary | ICD-10-CM | POA: Diagnosis not present

## 2023-11-16 DIAGNOSIS — R82998 Other abnormal findings in urine: Secondary | ICD-10-CM | POA: Diagnosis not present

## 2023-11-16 DIAGNOSIS — E1169 Type 2 diabetes mellitus with other specified complication: Secondary | ICD-10-CM | POA: Diagnosis not present

## 2023-11-16 DIAGNOSIS — I119 Hypertensive heart disease without heart failure: Secondary | ICD-10-CM | POA: Diagnosis not present

## 2023-11-16 DIAGNOSIS — Z0000 Encounter for general adult medical examination without abnormal findings: Secondary | ICD-10-CM | POA: Diagnosis not present

## 2024-01-14 NOTE — Progress Notes (Unsigned)
 Cardiology Office Note    Patient Name: Nathan Finley Date of Encounter: 01/16/2024  Primary Care Provider:  Rodrigo Ran, MD Primary Cardiologist:  Charlton Haws, MD Primary Electrophysiologist: None   Past Medical History    Past Medical History:  Diagnosis Date   Anal fissure    Anxiety    Benign neoplasm    Diabetes mellitus without complication (HCC)    Golfer's elbow    Hemorrhoids    Hyperlipidemia    Hypothyroidism    Migraine with aura    Morbid obesity (HCC)    Obesity    OSA (obstructive sleep apnea)    Tennis elbow    Testicular hypofunction    Thyroid disease    hypothyroidism   Toe pain     History of Present Illness  Nathan Finley is a 63 y.o. male with a PMH of coronary calcifications, HLD, DM type II, aortic atherosclerosis, hypothyroidism and, anxiety, obesity s/p gastric sleeve who presents today for 28-month follow-up.  Nathan Finley was last seen on 10/25/2023 for follow-up and endorsed occasional episodes of dizziness with standing.  He was noted to have controlled BP at 126/84 and EKG showed new left posterior fascicular block with TWI in lead III.  He underwent a 2D echo for further evaluation that showed EF of 55 to 60% with severe concentric LVH and mild aortic dilation of 42 mm.  Coronary CTA was completed and showed moderate LAD disease with no need for further evaluation and optimization of lipid management.  Nathan Finley presents today for three month follow up. He has lost significant weight from 248 pounds to 233 pounds, attributed to dietary changes, including reducing soda and white bread. He experiences occasional dizziness when standing, which he attributes to his height and possibly his medications. No associated palpitations, chest pain, or vertigo. He notes a buzzing sound in his ears when standing too quickly.  His blood pressure today was stable at 127/88 patient reports compliance with his current medication regimen. He uses THC in  moderation, either through edibles or smoking, which he finds beneficial for his mental health after discontinuing antidepressants.  Patient denies chest pain, palpitations, dyspnea, PND, orthopnea, nausea, vomiting, dizziness, syncope, edema, weight gain, or early satiety.  Discussed the use of AI scribe software for clinical note transcription with the patient, who gave verbal consent to proceed.  History of Present Illness   Review of Systems  Please see the history of present illness.    All other systems reviewed and are otherwise negative except as noted above.  Physical Exam    Wt Readings from Last 3 Encounters:  01/16/24 233 lb (105.7 kg)  10/25/23 248 lb 12.8 oz (112.9 kg)  08/04/22 258 lb (117 kg)   VS: Vitals:   01/16/24 0958  BP: 127/88  Pulse: 85  Resp: 16  SpO2: 94%  ,Body mass index is 29.92 kg/m. GEN: Well nourished, well developed in no acute distress Neck: No JVD; No carotid bruits Pulmonary: Clear to auscultation without rales, wheezing or rhonchi  Cardiovascular: Normal rate. Regular rhythm. Normal S1. Normal S2.   Murmurs: There is no murmur.  ABDOMEN: Soft, non-tender, non-distended EXTREMITIES:  No edema; No deformity   EKG/LABS/ Recent Cardiac Studies   ECG personally reviewed by me today -none completed today  Risk Assessment/Calculations:          Lab Results  Component Value Date   WBC 10.4 (A) 08/30/2016   HGB 16.2 08/30/2016   HCT 45.1 08/30/2016  MCV 82.7 08/30/2016   Lab Results  Component Value Date   CREATININE 1.05 10/25/2023   BUN 13 10/25/2023   NA 144 10/25/2023   K 5.2 10/25/2023   CL 104 10/25/2023   CO2 23 10/25/2023   Lab Results  Component Value Date   CHOL 261 (H) 10/15/2014   HDL 58 10/15/2014   LDLCALC 179 (H) 10/15/2014   TRIG 121 10/15/2014   CHOLHDL 4.5 10/15/2014    No results found for: "HGBA1C" Assessment & Plan    1.Coronary artery disease: -Patient completed Myoview in 2021 that showed no  evidence of ischemia -Today patient reports no chest pain or angina. -Moderate LAD disease, no catheterization needed. LDL well-controlled at 5 mg/dL. Effective lipid management. - Continue current lipid-lowering therapy including atorvastatin, Repatha, and ezetimibe. - Monitor cholesterol levels regularly.  2.  DM type II: -Well-controlled with current regimen. No recent complications. - Continue current diabetes medications including Synjardy.  3. Hyperlipidemia:  -Well-managed with medications, LDL at 5 mg/dL. Discussed potential reduction of medication burden. - Continue current lipid-lowering therapy. - Consider discontinuing ezetimibe if LDL remains controlled.  4. Pulmonary nodules -CT of the chest completed showing aortic atherosclerosis with stable right lobe pulmonary nodules -Continue follow-up with pulmonology  5.  Orthostasis: -Dizziness on standing, possibly due to venous insufficiency or medication. No palpitations or chest pain. - Recommend use of compression stockings. - Monitor for any changes in symptoms.  6.  Mild aortic dilation -2D echo measured 42 mm and CT of the chest measuring 42 mm -Patient will have repeat CT of the chest in 1 year     Disposition: Follow-up with Charlton Haws, MD or APP in 12 months    Signed, Napoleon Form, Leodis Rains, NP 01/16/2024, 10:27 AM [facility]

## 2024-01-16 ENCOUNTER — Ambulatory Visit: Payer: Federal, State, Local not specified - PPO | Admitting: Physician Assistant

## 2024-01-16 ENCOUNTER — Ambulatory Visit: Attending: Nurse Practitioner | Admitting: Nurse Practitioner

## 2024-01-16 ENCOUNTER — Encounter: Payer: Self-pay | Admitting: Nurse Practitioner

## 2024-01-16 VITALS — BP 127/88 | HR 85 | Resp 16 | Ht 74.0 in | Wt 233.0 lb

## 2024-01-16 DIAGNOSIS — I77810 Thoracic aortic ectasia: Secondary | ICD-10-CM | POA: Diagnosis not present

## 2024-01-16 DIAGNOSIS — I2510 Atherosclerotic heart disease of native coronary artery without angina pectoris: Secondary | ICD-10-CM | POA: Diagnosis not present

## 2024-01-16 DIAGNOSIS — E782 Mixed hyperlipidemia: Secondary | ICD-10-CM | POA: Diagnosis not present

## 2024-01-16 DIAGNOSIS — I700 Atherosclerosis of aorta: Secondary | ICD-10-CM

## 2024-01-16 DIAGNOSIS — I951 Orthostatic hypotension: Secondary | ICD-10-CM

## 2024-01-16 NOTE — Patient Instructions (Addendum)
 Medication Instructions:  Your physician recommends that you continue on your current medications as directed. Please refer to the Current Medication list given to you today. *If you need a refill on your cardiac medications before your next appointment, please call your pharmacy*  Lab Work: None ordered If you have labs (blood work) drawn today and your tests are completely normal, you will receive your results only by: MyChart Message (if you have MyChart) OR A paper copy in the mail If you have any lab test that is abnormal or we need to change your treatment, we will call you to review the results.  Testing/Procedures: Your physician has requested that you have an echocardiogram. Echocardiography is a painless test that uses sound waves to create images of your heart. It provides your doctor with information about the size and shape of your heart and how well your heart's chambers and valves are working. This procedure takes approximately one hour. There are no restrictions for this procedure. Please do NOT wear cologne, perfume, aftershave, or lotions (deodorant is allowed). Please arrive 15 minutes prior to your appointment time.  Please note: We ask at that you not bring children with you during ultrasound (echo/ vascular) testing. Due to room size and safety concerns, children are not allowed in the ultrasound rooms during exams. Our front office staff cannot provide observation of children in our lobby area while testing is being conducted. An adult accompanying a patient to their appointment will only be allowed in the ultrasound room at the discretion of the ultrasound technician under special circumstances. We apologize for any inconvenience.  Chest CTA w w/o  Follow-Up: At [facility], you and your health needs are our priority.  As part of our continuing mission to provide you with exceptional heart care, our providers are all part of one team.  This team includes your  primary Cardiologist (physician) and Advanced Practice Providers or APPs (Physician Assistants and Nurse Practitioners) who all work together to provide you with the care you need, when you need it.  Your next appointment:   10 month(s) February 2026  Provider:   Robin Searing, NP     We recommend signing up for the patient portal called "MyChart".  Sign up information is provided on this After Visit Summary.  MyChart is used to connect with patients for Virtual Visits (Telemedicine).  Patients are able to view lab/test results, encounter notes, upcoming appointments, etc.  Non-urgent messages can be sent to your provider as well.   To learn more about what you can do with MyChart, go to [email or web address].   Other Instructions Please get some ted hose or compression stockings. They can be purchased at your local medical supply store, [employer], [employer] or [employer] fund[employer].  Put them on first thing in the morning and wear them during the day . Elevate your feet during the day and remove hose in the evening before bed.       1st Floor: - Lobby - Registration  - Pharmacy  - Lab - Cafe  2nd Floor: - PV Lab - Diagnostic Testing (echo, CT, nuclear med)  3rd Floor: - Vacant  4th Floor: - TCTS (cardiothoracic surgery) - AFib Clinic - Structural Heart Clinic - Vascular Surgery  - Vascular Ultrasound  5th Floor: - HeartCare Cardiology (general and EP) - Clinical Pharmacy for coumadin, hypertension, lipid, weight-loss medications, and med management appointments    Valet parking services will be available as well.

## 2024-07-25 DIAGNOSIS — E785 Hyperlipidemia, unspecified: Secondary | ICD-10-CM | POA: Diagnosis not present

## 2024-07-25 DIAGNOSIS — Z23 Encounter for immunization: Secondary | ICD-10-CM | POA: Diagnosis not present

## 2024-07-25 DIAGNOSIS — I119 Hypertensive heart disease without heart failure: Secondary | ICD-10-CM | POA: Diagnosis not present

## 2024-07-25 DIAGNOSIS — E1169 Type 2 diabetes mellitus with other specified complication: Secondary | ICD-10-CM | POA: Diagnosis not present

## 2024-09-15 ENCOUNTER — Telehealth: Payer: Self-pay | Admitting: Cardiovascular Disease

## 2024-09-15 MED ORDER — PREDNISONE 50 MG PO TABS: ORAL_TABLET | ORAL | 0 refills | Status: AC

## 2024-09-15 NOTE — Telephone Encounter (Signed)
 Spoke with pt, aware will send in the premeds to [facility].

## 2024-09-15 NOTE — Telephone Encounter (Signed)
 Pt called to state he was allergic to iodine contrast and he needed this to before scheduling CT please advise

## 2024-10-17 ENCOUNTER — Other Ambulatory Visit (HOSPITAL_COMMUNITY)

## 2024-10-17 ENCOUNTER — Ambulatory Visit (HOSPITAL_COMMUNITY)

## 2024-10-30 ENCOUNTER — Ambulatory Visit (HOSPITAL_COMMUNITY): Payer: Self-pay | Admitting: Nurse Practitioner

## 2024-10-30 ENCOUNTER — Ambulatory Visit (HOSPITAL_COMMUNITY)
Admission: RE | Admit: 2024-10-30 | Discharge: 2024-10-30 | Disposition: A | Discharge status: Home / Self Care | Source: Ambulatory Visit | Attending: Nurse Practitioner | Admitting: Nurse Practitioner

## 2024-10-30 DIAGNOSIS — I700 Atherosclerosis of aorta: Secondary | ICD-10-CM | POA: Insufficient documentation

## 2024-10-30 DIAGNOSIS — E782 Mixed hyperlipidemia: Secondary | ICD-10-CM | POA: Insufficient documentation

## 2024-10-30 DIAGNOSIS — I77810 Thoracic aortic ectasia: Secondary | ICD-10-CM | POA: Diagnosis present

## 2024-10-30 DIAGNOSIS — I2510 Atherosclerotic heart disease of native coronary artery without angina pectoris: Secondary | ICD-10-CM | POA: Diagnosis not present

## 2024-10-30 LAB — ECHOCARDIOGRAM COMPLETE
Area-P 1/2: 3.42 cm2
S' Lateral: 2.9 cm

## 2024-11-07 ENCOUNTER — Encounter (HOSPITAL_COMMUNITY): Payer: Self-pay

## 2024-11-07 ENCOUNTER — Ambulatory Visit (HOSPITAL_COMMUNITY)
Admission: RE | Admit: 2024-11-07 | Discharge: 2024-11-07 | Disposition: A | Source: Ambulatory Visit | Attending: Cardiovascular Disease | Admitting: Cardiovascular Disease

## 2024-11-07 DIAGNOSIS — I700 Atherosclerosis of aorta: Secondary | ICD-10-CM

## 2024-11-07 DIAGNOSIS — E782 Mixed hyperlipidemia: Secondary | ICD-10-CM

## 2024-11-07 DIAGNOSIS — E119 Type 2 diabetes mellitus without complications: Secondary | ICD-10-CM

## 2024-11-07 DIAGNOSIS — I2510 Atherosclerotic heart disease of native coronary artery without angina pectoris: Secondary | ICD-10-CM

## 2024-11-07 DIAGNOSIS — I77810 Thoracic aortic ectasia: Secondary | ICD-10-CM
